# Patient Record
Sex: Female | Born: 1980 | Race: White | Hispanic: Yes | Marital: Married | Smoking: Never smoker
Health system: Southern US, Community
[De-identification: ages and names within clinical notes are randomized; demographics above are authoritative.]

## PROBLEM LIST (undated history)

## (undated) ENCOUNTER — Ambulatory Visit (HOSPITAL_COMMUNITY): Payer: Self-pay

## (undated) ENCOUNTER — Inpatient Hospital Stay (HOSPITAL_COMMUNITY): Payer: Self-pay

## (undated) DIAGNOSIS — Z789 Other specified health status: Secondary | ICD-10-CM

## (undated) HISTORY — PX: NO PAST SURGERIES: SHX2092

---

## 2009-11-15 ENCOUNTER — Ambulatory Visit: Payer: Self-pay | Admitting: Family Medicine

## 2009-11-15 ENCOUNTER — Inpatient Hospital Stay (HOSPITAL_COMMUNITY): Admission: AD | Admit: 2009-11-15 | Discharge: 2009-11-15 | Payer: Self-pay | Admitting: Obstetrics & Gynecology

## 2009-12-27 ENCOUNTER — Inpatient Hospital Stay (HOSPITAL_COMMUNITY)
Admission: AD | Admit: 2009-12-27 | Discharge: 2009-12-29 | Payer: Self-pay | Source: Home / Self Care | Admitting: Obstetrics & Gynecology

## 2009-12-27 ENCOUNTER — Ambulatory Visit: Payer: Self-pay | Admitting: Obstetrics and Gynecology

## 2010-05-09 LAB — CBC
MCH: 22.5 pg — ABNORMAL LOW (ref 26.0–34.0)
MCV: 69.5 fL — ABNORMAL LOW (ref 78.0–100.0)
Platelets: 256 10*3/uL (ref 150–400)
RDW: 17.9 % — ABNORMAL HIGH (ref 11.5–15.5)

## 2010-05-11 LAB — TYPE AND SCREEN
ABO/RH(D): O POS
Antibody Screen: NEGATIVE

## 2010-05-11 LAB — URINALYSIS, ROUTINE W REFLEX MICROSCOPIC
Bilirubin Urine: NEGATIVE
Glucose, UA: NEGATIVE mg/dL
Hgb urine dipstick: NEGATIVE
Protein, ur: NEGATIVE mg/dL
Specific Gravity, Urine: >1.03 — ABNORMAL HIGH (ref 1.005–1.030)
Urobilinogen, UA: 0.2 mg/dL (ref 0.0–1.0)

## 2010-05-11 LAB — URINE MICROSCOPIC-ADD ON

## 2010-05-11 LAB — RAPID URINE DRUG SCREEN, HOSP PERFORMED
Barbiturates: NOT DETECTED
Cocaine: NOT DETECTED
Opiates: NOT DETECTED
Tetrahydrocannabinol: NOT DETECTED

## 2010-05-11 LAB — CBC
Hemoglobin: 10.5 g/dL — ABNORMAL LOW (ref 12.0–15.0)
MCH: 23.8 pg — ABNORMAL LOW (ref 26.0–34.0)
MCHC: 33.1 g/dL (ref 30.0–36.0)
MCV: 71.8 fL — ABNORMAL LOW (ref 78.0–100.0)

## 2010-05-11 LAB — HEPATITIS B SURFACE ANTIGEN: Hepatitis B Surface Ag: NEGATIVE

## 2010-07-18 ENCOUNTER — Emergency Department (HOSPITAL_COMMUNITY)
Admission: EM | Admit: 2010-07-18 | Discharge: 2010-07-19 | Disposition: A | Discharge status: Home / Self Care | Payer: Self-pay | Attending: Emergency Medicine | Admitting: Emergency Medicine

## 2010-07-18 DIAGNOSIS — O036 Delayed or excessive hemorrhage following complete or unspecified spontaneous abortion: Secondary | ICD-10-CM | POA: Insufficient documentation

## 2010-07-18 DIAGNOSIS — N949 Unspecified condition associated with female genital organs and menstrual cycle: Secondary | ICD-10-CM | POA: Insufficient documentation

## 2010-07-18 DIAGNOSIS — R109 Unspecified abdominal pain: Secondary | ICD-10-CM | POA: Insufficient documentation

## 2010-07-18 DIAGNOSIS — R300 Dysuria: Secondary | ICD-10-CM | POA: Insufficient documentation

## 2010-07-18 DIAGNOSIS — O34599 Maternal care for other abnormalities of gravid uterus, unspecified trimester: Secondary | ICD-10-CM | POA: Insufficient documentation

## 2010-07-18 DIAGNOSIS — N83209 Unspecified ovarian cyst, unspecified side: Secondary | ICD-10-CM | POA: Insufficient documentation

## 2010-07-18 LAB — PREGNANCY, URINE: Preg Test, Ur: POSITIVE

## 2010-07-19 ENCOUNTER — Emergency Department (HOSPITAL_COMMUNITY): Payer: Self-pay

## 2010-07-19 LAB — DIFFERENTIAL
Basophils Absolute: 0 10*3/uL (ref 0.0–0.1)
Basophils Relative: 0 % (ref 0–1)
Eosinophils Absolute: 0.2 10*3/uL (ref 0.0–0.7)
Eosinophils Relative: 2 % (ref 0–5)
Lymphs Abs: 2.9 10*3/uL (ref 0.7–4.0)
Neutrophils Relative %: 48 % (ref 43–77)

## 2010-07-19 LAB — CBC
Platelets: 265 10*3/uL (ref 150–400)
RBC: 4.35 MIL/uL (ref 3.87–5.11)
RDW: 14.3 % (ref 11.5–15.5)
WBC: 7 10*3/uL (ref 4.0–10.5)

## 2010-07-19 LAB — WET PREP, GENITAL: Trich, Wet Prep: NONE SEEN

## 2010-07-19 LAB — ABO/RH: ABO/RH(D): O POS

## 2010-07-19 LAB — BASIC METABOLIC PANEL
CO2: 23 mEq/L (ref 19–32)
Calcium: 9.2 mg/dL (ref 8.4–10.5)
Chloride: 104 mEq/L (ref 96–112)
Creatinine, Ser: <0.47 mg/dL (ref 0.4–1.2)
Glucose, Bld: 93 mg/dL (ref 70–99)

## 2010-07-20 LAB — GC/CHLAMYDIA PROBE AMP, GENITAL: Chlamydia, DNA Probe: NEGATIVE

## 2011-02-27 NOTE — L&D Delivery Note (Cosign Needed)
Delivery Note At 9:06 AM a viable female was delivered via Vaginal, Spontaneous Delivery (Presentation: ; Occiput Anterior).  APGAR: 9, 9; weight pending.   Placenta status: normal .  Cord: 3 vessels with the following complications: None.  Anesthesia: None  Episiotomy: None Lacerations: None Est. Blood Loss (mL): 100  Mom to postpartum.  Baby to nursery-stable.  Clancy Gourd 08/08/2011, 9:21 AM

## 2011-07-09 ENCOUNTER — Encounter (HOSPITAL_COMMUNITY): Payer: Self-pay

## 2011-07-09 ENCOUNTER — Inpatient Hospital Stay (HOSPITAL_COMMUNITY): Payer: Self-pay

## 2011-07-09 ENCOUNTER — Inpatient Hospital Stay (HOSPITAL_COMMUNITY)
Admission: AD | Admit: 2011-07-09 | Discharge: 2011-07-09 | Disposition: A | Discharge status: Home / Self Care | Payer: Self-pay | Source: Ambulatory Visit | Attending: Obstetrics & Gynecology | Admitting: Obstetrics & Gynecology

## 2011-07-09 DIAGNOSIS — O0933 Supervision of pregnancy with insufficient antenatal care, third trimester: Secondary | ICD-10-CM

## 2011-07-09 DIAGNOSIS — O26893 Other specified pregnancy related conditions, third trimester: Secondary | ICD-10-CM

## 2011-07-09 DIAGNOSIS — R51 Headache: Secondary | ICD-10-CM | POA: Insufficient documentation

## 2011-07-09 DIAGNOSIS — O288 Other abnormal findings on antenatal screening of mother: Secondary | ICD-10-CM

## 2011-07-09 DIAGNOSIS — O99891 Other specified diseases and conditions complicating pregnancy: Secondary | ICD-10-CM | POA: Insufficient documentation

## 2011-07-09 DIAGNOSIS — O0930 Supervision of pregnancy with insufficient antenatal care, unspecified trimester: Secondary | ICD-10-CM | POA: Insufficient documentation

## 2011-07-09 DIAGNOSIS — O4190X0 Disorder of amniotic fluid and membranes, unspecified, unspecified trimester, not applicable or unspecified: Secondary | ICD-10-CM

## 2011-07-09 HISTORY — DX: Other specified health status: Z78.9

## 2011-07-09 LAB — COMPREHENSIVE METABOLIC PANEL
AST: 11 U/L (ref 0–37)
Albumin: 2.3 g/dL — ABNORMAL LOW (ref 3.5–5.2)
BUN: 7 mg/dL (ref 6–23)
Calcium: 8.7 mg/dL (ref 8.4–10.5)
Creatinine, Ser: 0.37 mg/dL — ABNORMAL LOW (ref 0.50–1.10)
GFR calc non Af Amer: >90 mL/min (ref >90)

## 2011-07-09 LAB — CBC
Hemoglobin: 11 g/dL — ABNORMAL LOW (ref 12.0–15.0)
RBC: 4.8 MIL/uL (ref 3.87–5.11)
WBC: 9.5 10*3/uL (ref 4.0–10.5)

## 2011-07-09 LAB — RPR: RPR Ser Ql: NONREACTIVE

## 2011-07-09 LAB — URINALYSIS, ROUTINE W REFLEX MICROSCOPIC
Bilirubin Urine: NEGATIVE
Glucose, UA: NEGATIVE mg/dL
Ketones, ur: NEGATIVE mg/dL
Nitrite: NEGATIVE
Specific Gravity, Urine: 1.015 (ref 1.005–1.030)
pH: 6 (ref 5.0–8.0)

## 2011-07-09 LAB — DIFFERENTIAL
Lymphocytes Relative: 20 % (ref 12–46)
Lymphs Abs: 1.9 10*3/uL (ref 0.7–4.0)
Monocytes Relative: 9 % (ref 3–12)
Neutro Abs: 6.7 10*3/uL (ref 1.7–7.7)
Neutrophils Relative %: 71 % (ref 43–77)

## 2011-07-09 LAB — TYPE AND SCREEN: ABO/RH(D): O POS

## 2011-07-09 LAB — RUBELLA SCREEN: Rubella: 199.3 IU/mL — ABNORMAL HIGH

## 2011-07-09 LAB — URINE MICROSCOPIC-ADD ON

## 2011-07-09 LAB — WET PREP, GENITAL
Clue Cells Wet Prep HPF POC: NONE SEEN
Yeast Wet Prep HPF POC: NONE SEEN

## 2011-07-09 LAB — PROTEIN / CREATININE RATIO, URINE
Creatinine, Urine: 44.72 mg/dL
Protein Creatinine Ratio: 0.13 (ref 0.00–0.15)
Total Protein, Urine: 6 mg/dL

## 2011-07-09 MED ORDER — ACETAMINOPHEN 325 MG PO TABS
650.0000 mg | ORAL_TABLET | Freq: Once | ORAL | Status: AC
  Administered 2011-07-09: 650 mg via ORAL
  Filled 2011-07-09: qty 2

## 2011-07-09 NOTE — MAU Note (Signed)
Headache, face hurts.  Hands are swelling.  Pressure in bones. "cold"  5a.m. Boy was numb.  Due ? In August. No prenatal care.

## 2011-07-09 NOTE — MAU Note (Signed)
No adverse effect from tylenol, rates pain same-9/10.

## 2011-07-09 NOTE — Progress Notes (Signed)
[facility] Department of Clinical Social Work Documentation of Interpretation   I assisted ___Jolynn RN________________ with interpretation of __questions____________________ for this patient. 

## 2011-07-09 NOTE — MAU Provider Note (Signed)
History     CSN: [id number]  Arrival date and time: 07/09/11 1147   First Provider Initiated Contact with Patient 07/09/11 1314      Chief Complaint  Patient presents with  . Abdominal Pain  . Generalized Body Aches  . 7 Months no PNC    HPI [name] is 31 y.o. [id number] LMP 12/21/10.  28 weeks presenting with headache rating it 9/10 that is radiating down the right side of her face, bones hurting, hands are swollen, dysuria, and pressure from the baby.  No prenatal care states she has tried but doesn't qualify and doesn't have money.  Denies changes in speech, balance, or weakness.  Denies vaginal bleeding or discharge.  Eda,translator reports she has not eaten in 18 hours because she wasn't hungry and she didn't feel well.    Past Medical History  Diagnosis Date  . No pertinent past medical history     Past Surgical History  Procedure Date  . No past surgeries     Family History  Problem Relation Age of Onset  . Anesthesia problems Neg Hx     History  Substance Use Topics  . Smoking status: Never Smoker   . Smokeless tobacco: Never Used  . Alcohol Use: No    Allergies: No Known Allergies  No prescriptions prior to admission    Review of Systems  Gastrointestinal: Positive for melena ( pelvic pressure.  Negative for vaginal bleeding or discharge). Negative for nausea, vomiting, abdominal pain, diarrhea and constipation.  Genitourinary: Positive for dysuria.  Musculoskeletal:       +bone pain  Neurological: Positive for headaches.   Physical Exam   Blood pressure 136/85, pulse 94, temperature 97.5 F (36.4 C), temperature source Oral, resp. rate 20, height 5\' 1"  (1.549 m), weight 89.812 kg (198 lb), SpO2 100.00%.  Physical Exam  Constitutional: She is oriented to person, place, and time. She appears well-developed and well-nourished. She appears ill.  HENT:  Head: Normocephalic.  Eyes: Pupils are equal, round, and reactive to light.    Cardiovascular: Normal rate.   Respiratory: Effort normal.  GI: Soft. There is no tenderness. There is no rebound and no guarding.       Fundal height palpated at greater than LMP would have her.  Genitourinary: Uterus is enlarged (gravid). No tenderness or bleeding around the vagina. No vaginal discharge found.       Cervix is fingertip and 50% effaced.   Neurological: She is alert and oriented to person, place, and time. Coordination normal.  Skin: Skin is warm and dry.   Results for orders placed during the hospital encounter of 07/09/11 (from the past 24 hour(s))  URINALYSIS, ROUTINE W REFLEX MICROSCOPIC     Status: Abnormal   Collection Time   07/09/11 12:26 PM      Component Value Range   Color, Urine YELLOW  YELLOW    APPearance CLEAR  CLEAR    Specific Gravity, Urine 1.015  1.005 - 1.030    pH 6.0  5.0 - 8.0    Glucose, UA NEGATIVE  NEGATIVE (mg/dL)   Hgb urine dipstick NEGATIVE  NEGATIVE    Bilirubin Urine NEGATIVE  NEGATIVE    Ketones, ur NEGATIVE  NEGATIVE (mg/dL)   Protein, ur NEGATIVE  NEGATIVE (mg/dL)   Urobilinogen, UA 0.2  0.0 - 1.0 (mg/dL)   Nitrite NEGATIVE  NEGATIVE    Leukocytes, UA SMALL (*) NEGATIVE   URINE MICROSCOPIC-ADD ON     Status: Abnormal  Collection Time   07/09/11 12:26 PM      Component Value Range   Squamous Epithelial / LPF FEW (*) RARE    WBC, UA 7-10  <3 (WBC/hpf)  WET PREP, GENITAL     Status: Abnormal   Collection Time   07/09/11  1:44 PM      Component Value Range   Yeast Wet Prep HPF POC NONE SEEN  NONE SEEN    Trich, Wet Prep NONE SEEN  NONE SEEN    Clue Cells Wet Prep HPF POC NONE SEEN  NONE SEEN    WBC, Wet Prep HPF POC TOO NUMEROUS TO COUNT (*) NONE SEEN   CBC     Status: Abnormal   Collection Time   07/09/11  1:59 PM      Component Value Range   WBC 9.5  4.0 - 10.5 (K/uL)   RBC 4.80  3.87 - 5.11 (MIL/uL)   Hemoglobin 11.0 (*) 12.0 - 15.0 (g/dL)   HCT 78.4 (*) 69.6 - 46.0 (%)   MCV 71.9 (*) 78.0 - 100.0 (fL)   MCH 22.9 (*)  26.0 - 34.0 (pg)   MCHC 31.9  30.0 - 36.0 (g/dL)   RDW 29.5 (*) 28.4 - 15.5 (%)   Platelets 214  150 - 400 (K/uL)  DIFFERENTIAL     Status: Normal   Collection Time   07/09/11  1:59 PM      Component Value Range   Neutrophils Relative 71  43 - 77 (%)   Neutro Abs 6.7  1.7 - 7.7 (K/uL)   Lymphocytes Relative 20  12 - 46 (%)   Lymphs Abs 1.9  0.7 - 4.0 (K/uL)   Monocytes Relative 9  3 - 12 (%)   Monocytes Absolute 0.8  0.1 - 1.0 (K/uL)   Eosinophils Relative 1  0 - 5 (%)   Eosinophils Absolute 0.1  0.0 - 0.7 (K/uL)   Basophils Relative 0  0 - 1 (%)   Basophils Absolute 0.0  0.0 - 0.1 (K/uL)  COMPREHENSIVE METABOLIC PANEL     Status: Abnormal   Collection Time   07/09/11  1:59 PM      Component Value Range   Sodium 136  135 - 145 (mEq/L)   Potassium 3.4 (*) 3.5 - 5.1 (mEq/L)   Chloride 104  96 - 112 (mEq/L)   CO2 21  19 - 32 (mEq/L)   Glucose, Bld 79  70 - 99 (mg/dL)   BUN 7  6 - 23 (mg/dL)   Creatinine, Ser 1.32 (*) 0.50 - 1.10 (mg/dL)   Calcium 8.7  8.4 - 44.0 (mg/dL)   Total Protein 6.1  6.0 - 8.3 (g/dL)   Albumin 2.3 (*) 3.5 - 5.2 (g/dL)   AST 11  0 - 37 (U/L)   ALT 6  0 - 35 (U/L)   Alkaline Phosphatase 195 (*) 39 - 117 (U/L)   Total Bilirubin 0.3  0.3 - 1.2 (mg/dL)   GFR calc non Af Amer >90  >90 (mL/min)   GFR calc Af Amer >90  >90 (mL/min)  TYPE AND SCREEN     Status: Normal   Collection Time   07/09/11  2:00 PM      Component Value Range   ABO/RH(D) O POS     Antibody Screen NEG     Sample Expiration 07/12/2011      Filed Vitals:   07/09/11 1409 07/09/11 1414 07/09/11 1417 07/09/11 1419  BP:   122/75   Pulse:  90 95 86 92  Temp:      TempSrc:      Resp:      Height:      Weight:      SpO2: 99% 98%  99%  ULTRASOUND:  [redacted]w[redacted]d with FHR 156.  EDD 08/17/11.  Cephalic, anterior placenta.  AFI 10.21, 21% with largest pocket 3.43cm.  Suboptimal visualiation of anatomy due to advanced GA, however no fetal anomalies seen.  4cm left adnexal cyst with benign features.     MAU Course  Procedures  Protein/creatine ratio pending.  MDM 16:14  Patient states that her headache is much better says only a  Little pain now.  Blood pressure is lower since admission.   16:50  With interpreter, I reported her lab and ultrasound results.  Also that the clinic will call her in a few days to set up appointment and repeat AFI.   Assessment and Plan  A:  Advanced pregnancy at [redacted]w[redacted]d without prenatal care      Headache much improved with tylenol     Subjectively low AFI      P:  Appointment requested from Clinic       Will need repeat AFI in 1 week Labor precautions given including decreased fetal movement.     Kick counts given.  May take tylenol for headache, discomfort  Reeve Mallo,EVE M 07/09/2011, 1:28 PM

## 2011-07-09 NOTE — Discharge Instructions (Signed)
Prevencin de Sport and exercise psychologist (Preventing Preterm Labor) Un parto prematuro ocurre cuando la mujer embarazada tiene contracciones uterinas que causan la apertura, el acortamiento y el afinamiento del cuello del tero, antes de las 37 semanas de [place]. Tendr contracciones regulares cada 2 a 3 minutos. Esto generalmente causa molestias o dolor. CUIDADOS EN EL HOGAR  Consuma una dieta saludable.   [name] las vitaminas segn le haya indicado el mdico.   Beba una cantidad de lquido suficiente como para [name] la orina de tono claro o color amarillo plido todos [name].   Descanse y duerma.   No tenga relaciones sexuales si tiene un parto prematuro o alto riesgo de tenerlo.   Siga las instrucciones del mdico acerca de su [name], los medicamentos y los exmenes.   Evite el estrs.   Evite los esfuerzos extenuantes o la actividad fsica extensa.   No fume.  SOLICITE AYUDA DE INMEDIATO SI:   Tiene contracciones.   Siente dolor abdominal.   Tiene sangrado que proviene de la vagina.   Siente dolor al [name].   Observa una secrecin anormal que proviene de la vagina.   Tiene una temperatura oral de ms de 102 F (38.9 C).  ASEGRESE DE QUE:  Comprende estas instrucciones.   Controlar su enfermedad.   Solicitar ayuda si no mejora o si empeora.  Document Released: 03/17/2010 Document Revised: 02/01/2011 [name] Patient Information 2012 [name], [name].Mtodo para contar los movimientos fetales (Fetal Movement Counts) Nombre de la paciente: __________________________________________________ [name] probable de parto:____________________ En los embarazos de alto riesgo se recomienda contar las pataditas, pero tambin es una buena idea que lo hagan todas las [name]. Comience a contarlas a las 28 semanas de embarazo. Los movimientos fetales aumentan luego de una comida Immunologist o de comer o beber algo dulce (el nivel de azcar en la sangre est ms alto). Tambin es  importante beber gran cantidad de lquidos (hidratarse bien) antes de contar. Si se recuesta sobre el lado izquierdo mejorar la Designer, industrial/product, o puede sentarse en una silla cmoda con los brazos sobre el abdomen y sin distracciones que la rodeen. CONTANDO  Trate de contar a la [name] lo haga.   Marque el da y la hora y vea cunto le lleva sentir 10 movimientos (patadas, agitaciones, sacudones, vueltas). Debe sentir al menos 10 movimientos en 2 horas. Probablemente sienta los 10 movimientos en menos de dos horas. Si no los siente, espere una hora y cuente nuevamente. Luego de [name] tendr un patrn.   Debemos observar si hay cambios en el patrn o no hay suficientes pataditas en 2 horas. Le lleva ms tiempo contar los 10 movimientos?  SOLICITE ATENCIN MDICA SI:  Siente menos de 10 pataditas en 2 horas. Intntelo dos veces.   No siente movimientos durante 1 hora.   El patrn se modifica o le lleva ms tiempo Art gallery manager las 10 pataditas.   Siente que el beb no se mueve como lo hace habitualmente.  Fecha: ____________ Movimientos: ____________ Comienzo hora: ____________ [name]: ____________ [name]: ____________ Movimientos: ____________ Comienzo hora: ____________ [name]: ____________ [name]: ____________ Movimientos: ____________ Comienzo hora: ____________ [name]: ____________ [name]: ____________ Movimientos: ____________ Comienzo hora: ____________ [name]: ____________ [name]: ____________ Movimientos: ____________ Comienzo hora: ____________ [name]: ____________ [name]: ____________ Movimientos: ____________ Comienzo hora: ____________ [name]: ____________ [name]: ____________ Movimientos: ____________ Comienzo hora: ____________ [name]: ____________  [name]: ____________ Movimientos: ____________ Comienzo hora: ____________ [name]: ____________ [name]: ____________ Movimientos: ____________ Comienzo hora:  ____________ [name]:  ____________ [name]: ____________ Movimientos: ____________ Comienzo hora: ____________ [name]: ____________ [name]: ____________ Movimientos: ____________ Comienzo hora: ____________ [name]: ____________ [name]: ____________ Movimientos: ____________ Comienzo hora: ____________ [name]: ____________ [name]: ____________ Movimientos: ____________ Comienzo hora: ____________ [name]: ____________ [name]: ____________ Movimientos: ____________ Comienzo hora: ____________ [name]: ____________  [name]: ____________ Movimientos: ____________ Comienzo hora: ____________ [name]: ____________ [name]: ____________ Movimientos: ____________ Comienzo hora: ____________ [name]: ____________ [name]: ____________ Movimientos: ____________ Comienzo hora: ____________ [name]: ____________ [name]: ____________ Movimientos: ____________ Comienzo hora: ____________ [name]: ____________ [name]: ____________ Movimientos: ____________ Comienzo hora: ____________ [name]: ____________ [name]: ____________ Movimientos: ____________ Comienzo hora: ____________ [name]: ____________ [name]: ____________ Movimientos: ____________ Comienzo hora: ____________ [name]: ____________  [name]: ____________ Movimientos: ____________ Comienzo hora: ____________ [name]: ____________ [name]: ____________ Movimientos: ____________ Comienzo hora: ____________ [name]: ____________ [name]: ____________ Movimientos: ____________ Comienzo hora: ____________ [name]: ____________ [name]: ____________ Movimientos: ____________ Comienzo hora: ____________ [name]: ____________ [name]: ____________ Movimientos: ____________ Comienzo hora: ____________ [name]: ____________ [name]: ____________ Movimientos: ____________ Comienzo hora: ____________ [name]: ____________ [name]: ____________ Movimientos: ____________ Comienzo hora: ____________ [name]: ____________  [name]: ____________ Movimientos: ____________ Comienzo hora:  ____________ [name]: ____________ [name]: ____________ Movimientos: ____________ Comienzo hora: ____________ [name]: ____________ [name]: ____________ Movimientos: ____________ Comienzo hora: ____________ [name]: ____________ [name]: ____________ Movimientos: ____________ Comienzo hora: ____________ [name]: ____________ [name]: ____________ Movimientos: ____________ Comienzo hora: ____________ [name]: ____________ [name]: ____________ Movimientos: ____________ Comienzo hora: ____________ [name]: ____________ [name]: ____________ Movimientos: ____________ Comienzo hora: ____________ [name]: ____________  [name]: ____________ Movimientos: ____________ Comienzo hora: ____________ [name]: ____________ [name]: ____________ Movimientos: ____________ Comienzo hora: ____________ [name]: ____________ [name]: ____________ Movimientos: ____________ Comienzo hora: ____________ [name]: ____________ [name]: ____________ Movimientos: ____________ Comienzo hora: ____________ [name]: ____________ [name]: ____________ Movimientos: ____________ Comienzo hora: ____________ [name]: ____________ [name]: ____________ Movimientos: ____________ Comienzo hora: ____________ [name]: ____________ [name]: ____________ Movimientos: ____________ Comienzo hora: ____________ [name]: ____________  [name]: ____________ Movimientos: ____________ Comienzo hora: ____________ [name]: ____________ [name]: ____________ Movimientos: ____________ Comienzo hora: ____________ [name]: ____________ [name]: ____________ Movimientos: ____________ Comienzo hora: ____________ [name]: ____________ [name]: ____________ Movimientos: ____________ Comienzo hora: ____________ [name]: ____________ [name]: ____________ Movimientos: ____________ Comienzo hora: ____________ [name]: ____________ [name]: ____________ Movimientos: ____________ Comienzo hora: ____________ [name]: ____________ [name]: ____________ Movimientos: ____________  Comienzo hora: ____________ [name]: ____________  [name]: ____________ Movimientos: ____________ Comienzo hora: ____________ [name]: ____________ [name]: ____________ Movimientos: ____________ Comienzo hora: ____________ [name]: ____________ [name]: ____________ Movimientos: ____________ Comienzo hora: ____________ [name]: ____________ [name]: ____________ Movimientos: ____________ Comienzo hora: ____________ [name]: ____________ [name]: ____________ Movimientos: ____________ Comienzo hora: ____________ [name]: ____________ [name]: ____________ Movimientos: ____________ Comienzo hora: ____________ [name]: ____________ [name]: ____________ Movimientos: ____________ Comienzo hora: ____________ Fin hora: ____________  Document Released: 05/22/2007 Document Revised: 02/01/2011 [employer] Patient Information 2012 [place] [place].

## 2011-07-10 LAB — GC/CHLAMYDIA PROBE AMP, GENITAL
Chlamydia, DNA Probe: NEGATIVE
GC Probe Amp, Genital: NEGATIVE

## 2011-07-10 NOTE — MAU Provider Note (Signed)
Attestation of Attending Supervision of Advanced Practitioner: Evaluation and management procedures were performed by the [facility] Fellow/PA/CNM/NP under my supervision and collaboration. Chart reviewed, and agree with management and plan.  Jaynie Collins, M.D. 07/10/2011 9:34 AM

## 2011-08-03 ENCOUNTER — Inpatient Hospital Stay (HOSPITAL_COMMUNITY)
Admission: AD | Admit: 2011-08-03 | Discharge: 2011-08-03 | Disposition: A | Discharge status: Home / Self Care | Payer: Self-pay | Source: Ambulatory Visit | Attending: Obstetrics & Gynecology | Admitting: Obstetrics & Gynecology

## 2011-08-03 ENCOUNTER — Encounter (HOSPITAL_COMMUNITY): Payer: Self-pay | Admitting: *Deleted

## 2011-08-03 DIAGNOSIS — O0930 Supervision of pregnancy with insufficient antenatal care, unspecified trimester: Secondary | ICD-10-CM | POA: Insufficient documentation

## 2011-08-03 DIAGNOSIS — O368190 Decreased fetal movements, unspecified trimester, not applicable or unspecified: Secondary | ICD-10-CM | POA: Insufficient documentation

## 2011-08-03 DIAGNOSIS — O288 Other abnormal findings on antenatal screening of mother: Secondary | ICD-10-CM

## 2011-08-03 LAB — COMPREHENSIVE METABOLIC PANEL
AST: 10 U/L (ref 0–37)
Albumin: 2.1 g/dL — ABNORMAL LOW (ref 3.5–5.2)
BUN: 6 mg/dL (ref 6–23)
Chloride: 104 mEq/L (ref 96–112)
Creatinine, Ser: 0.54 mg/dL (ref 0.50–1.10)
Potassium: 3.6 mEq/L (ref 3.5–5.1)
Total Bilirubin: 0.2 mg/dL — ABNORMAL LOW (ref 0.3–1.2)
Total Protein: 5.8 g/dL — ABNORMAL LOW (ref 6.0–8.3)

## 2011-08-03 MED ORDER — HYDROXYZINE HCL 25 MG PO TABS: 25.0000 mg | ORAL_TABLET | Freq: Three times a day (TID) | ORAL | Status: DC | PRN

## 2011-08-03 MED ORDER — HYDROXYZINE HCL 25 MG PO TABS
25.0000 mg | ORAL_TABLET | Freq: Three times a day (TID) | ORAL | Status: DC | PRN
  Administered 2011-08-03: 25 mg via ORAL
  Filled 2011-08-03: qty 1

## 2011-08-03 NOTE — MAU Note (Signed)
Patient states she has had no prenatal care. States the baby has not been moving well. Denies any pain at this time, has some 2 days ago. No bleeding or leaking fluid.

## 2011-08-03 NOTE — MAU Provider Note (Signed)
  History     CSN: [id number]  Arrival date and time: 08/03/11 1336   First Provider Initiated Contact with Patient 08/03/11 1501      Chief Complaint  Patient presents with  . Decreased Fetal Movement   HPI This is a 31 year old [id number] at [redacted] weeks EGA with no prenatal care.  Presents with decreased fetal activity - no fetal movement since Monday.  No provoking or palliating factors.  No vaginal discharge, leaking fluid, or bleeding.  OB History    Grav Para Term Preterm Abortions TAB SAB Ect Mult Living   7 5 5  1  1   5       Past Medical History  Diagnosis Date  . No pertinent past medical history     Past Surgical History  Procedure Date  . No past surgeries     Family History  Problem Relation Age of Onset  . Anesthesia problems Neg Hx     History  Substance Use Topics  . Smoking status: Never Smoker   . Smokeless tobacco: Never Used  . Alcohol Use: No    Allergies: No Known Allergies  Prescriptions prior to admission  Medication Sig Dispense Refill  . Prenatal Vit-Fe Fumarate-FA (PRENATAL MULTIVITAMIN) TABS Take 1 tablet by mouth daily.        ROS Physical Exam   Blood pressure 121/77, pulse 91, temperature 98.7 F (37.1 C), resp. rate 20, height 5\' 1"  (1.549 m), weight 92.08 kg (203 lb), SpO2 99.00%.  Physical Exam  Constitutional: She is oriented to person, place, and time. She appears well-developed and well-nourished.  Neurological: She is alert and oriented to person, place, and time.  Skin: Skin is warm and dry.  Psychiatric: She has a normal mood and affect. Her behavior is normal. Judgment and thought content normal.    MAU Course  Procedures NST - category 1 tracing with multiple accelerations seen.  One isolated late decel seen, but no further episodes after 1 hour of monitoring.  MDM   Assessment and Plan  1.  Decreased fetal activity.  2.  Low normal AFI.    Patient given juice and observed - had multiple movements.  Scheduled  outpatient [place] for AFI.  Ambika Zettlemoyer JEHIEL 08/03/2011, 4:27 PM

## 2011-08-03 NOTE — MAU Note (Signed)
States clinic never called her with an appointment. States she called the clinic and they said they would call her back, but never did. States she was told she had low fluid. States she passed some foul smelling yellowish fluid about 3 days ago.

## 2011-08-03 NOTE — Progress Notes (Signed)
[facility] Department of Clinical Social Work Documentation of Interpretation   I assisted _Susan RN__________________ with interpretation of ___questions___________________ for this patient. 

## 2011-08-03 NOTE — Discharge Instructions (Signed)
Mtodo para contar los movimientos fetales (Fetal Movement Counts) Nombre de la paciente: __________________________________________________ Fecha probable de parto:____________________ En los embarazos de alto riesgo se recomienda contar las pataditas, pero tambin es una buena idea que lo hagan todas las embarazadas. Comience a contarlas a las 28 semanas de embarazo. Los movimientos fetales aumentan luego de una comida completa o de comer o beber algo dulce (el nivel de azcar en la sangre est ms alto). Tambin es importante beber gran cantidad de lquidos (hidratarse bien) antes de contar. Si se recuesta sobre el lado izquierdo mejorar la circulacin, o puede sentarse en una silla cmoda con los brazos sobre el abdomen y sin distracciones que la rodeen. CONTANDO  Trate de contar a la misma hora todos los das que lo haga.   Marque el da y la hora y vea cunto le lleva sentir 10 movimientos (patadas, agitaciones, sacudones, vueltas). Debe sentir al menos 10 movimientos en 2 horas. Probablemente sienta los 10 movimientos en menos de dos horas. Si no los siente, espere una hora y cuente nuevamente. Luego de algunos das tendr un patrn.   Debemos observar si hay cambios en el patrn o no hay suficientes pataditas en 2 horas. Le lleva ms tiempo contar los 10 movimientos?  SOLICITE ATENCIN MDICA SI:  Siente menos de 10 pataditas en 2 horas. Intntelo dos veces.   No siente movimientos durante 1 hora.   El patrn se modifica o le lleva ms tiempo cada da contar las 10 pataditas.   Siente que el beb no se mueve como lo hace habitualmente.  Fecha: ____________ Movimientos: ____________ Comienzo hora: ____________ Fin hora: ____________ Fecha: ____________ Movimientos: ____________ Comienzo hora: ____________ Fin hora: ____________ Fecha: ____________ Movimientos: ____________ Comienzo hora: ____________ Fin hora: ____________ Fecha: ____________ Movimientos: ____________ Comienzo hora:  ____________ Fin hora: ____________ Fecha: ____________ Movimientos: ____________ Comienzo hora: ____________ Fin hora: ____________ Fecha: ____________ Movimientos: ____________ Comienzo hora: ____________ Fin hora: ____________ Fecha: ____________ Movimientos: ____________ Comienzo hora: ____________ Fin hora: ____________  Fecha: ____________ Movimientos: ____________ Comienzo hora: ____________ Fin hora: ____________ Fecha: ____________ Movimientos: ____________ Comienzo hora: ____________ Fin hora: ____________ Fecha: ____________ Movimientos: ____________ Comienzo hora: ____________ Fin hora: ____________ Fecha: ____________ Movimientos: ____________ Comienzo hora: ____________ Fin hora: ____________ Fecha: ____________ Movimientos: ____________ Comienzo hora: ____________ Fin hora: ____________ Fecha: ____________ Movimientos: ____________ Comienzo hora: ____________ Fin hora: ____________ Fecha: ____________ Movimientos: ____________ Comienzo hora: ____________ Fin hora: ____________  Fecha: ____________ Movimientos: ____________ Comienzo hora: ____________ Fin hora: ____________ Fecha: ____________ Movimientos: ____________ Comienzo hora: ____________ Fin hora: ____________ Fecha: ____________ Movimientos: ____________ Comienzo hora: ____________ Fin hora: ____________ Fecha: ____________ Movimientos: ____________ Comienzo hora: ____________ Fin hora: ____________ Fecha: ____________ Movimientos: ____________ Comienzo hora: ____________ Fin hora: ____________ Fecha: ____________ Movimientos: ____________ Comienzo hora: ____________ Fin hora: ____________ Fecha: ____________ Movimientos: ____________ Comienzo hora: ____________ Fin hora: ____________  Fecha: ____________ Movimientos: ____________ Comienzo hora: ____________ Fin hora: ____________ Fecha: ____________ Movimientos: ____________ Comienzo hora: ____________ Fin hora: ____________ Fecha: ____________ Movimientos: ____________  Comienzo hora: ____________ Fin hora: ____________ Fecha: ____________ Movimientos: ____________ Comienzo hora: ____________ Fin hora: ____________ Fecha: ____________ Movimientos: ____________ Comienzo hora: ____________ Fin hora: ____________ Fecha: ____________ Movimientos: ____________ Comienzo hora: ____________ Fin hora: ____________ Fecha: ____________ Movimientos: ____________ Comienzo hora: ____________ Fin hora: ____________  Fecha: ____________ Movimientos: ____________ Comienzo hora: ____________ Fin hora: ____________ Fecha: ____________ Movimientos: ____________ Comienzo hora: ____________ Fin hora: ____________ Fecha: ____________ Movimientos: ____________ Comienzo hora: ____________ Fin hora: ____________ Fecha: ____________ Movimientos: ____________ Comienzo hora: ____________ Fin hora: ____________ Fecha: ____________ Movimientos:   ____________ Comienzo hora: ____________ Fin hora: ____________ Fecha: ____________ Movimientos: ____________ Comienzo hora: ____________ Fin hora: ____________ Fecha: ____________ Movimientos: ____________ Comienzo hora: ____________ Fin hora: ____________  Fecha: ____________ Movimientos: ____________ Comienzo hora: ____________ Fin hora: ____________ Fecha: ____________ Movimientos: ____________ Comienzo hora: ____________ Fin hora: ____________ Fecha: ____________ Movimientos: ____________ Comienzo hora: ____________ Fin hora: ____________ Fecha: ____________ Movimientos: ____________ Comienzo hora: ____________ Fin hora: ____________ Fecha: ____________ Movimientos: ____________ Comienzo hora: ____________ Fin hora: ____________ Fecha: ____________ Movimientos: ____________ Comienzo hora: ____________ Fin hora: ____________ Fecha: ____________ Movimientos: ____________ Comienzo hora: ____________ Fin hora: ____________  Fecha: ____________ Movimientos: ____________ Comienzo hora: ____________ Fin hora: ____________ Fecha: ____________ Movimientos:  ____________ Comienzo hora: ____________ Fin hora: ____________ Fecha: ____________ Movimientos: ____________ Comienzo hora: ____________ Fin hora: ____________ Fecha: ____________ Movimientos: ____________ Comienzo hora: ____________ Fin hora: ____________ Fecha: ____________ Movimientos: ____________ Comienzo hora: ____________ Fin hora: ____________ Fecha: ____________ Movimientos: ____________ Comienzo hora: ____________ Fin hora: ____________ Fecha: ____________ Movimientos: ____________ Comienzo hora: ____________ Fin hora: ____________  Fecha: ____________ Movimientos: ____________ Comienzo hora: ____________ Fin hora: ____________ Fecha: ____________ Movimientos: ____________ Comienzo hora: ____________ Fin hora: ____________ Fecha: ____________ Movimientos: ____________ Comienzo hora: ____________ Fin hora: ____________ Fecha: ____________ Movimientos: ____________ Comienzo hora: ____________ Fin hora: ____________ Fecha: ____________ Movimientos: ____________ Comienzo hora: ____________ Fin hora: ____________ Fecha: ____________ Movimientos: ____________ Comienzo hora: ____________ Fin hora: ____________ Fecha: ____________ Movimientos: ____________ Comienzo hora: ____________ Fin hora: ____________  Document Released: 05/22/2007 Document Revised: 02/01/2011 ExitCare Patient Information 2012 [employer]. 

## 2011-08-06 LAB — CULTURE, BETA STREP (GROUP B ONLY)

## 2011-08-07 ENCOUNTER — Encounter: Payer: Self-pay | Admitting: Advanced Practice Midwife

## 2011-08-07 ENCOUNTER — Telehealth: Payer: Self-pay | Admitting: Advanced Practice Midwife

## 2011-08-07 NOTE — Telephone Encounter (Signed)
Called patient about appointment. No answer. Will be sending a letter about this appointment.

## 2011-08-08 ENCOUNTER — Encounter (HOSPITAL_COMMUNITY): Payer: Self-pay | Admitting: *Deleted

## 2011-08-08 ENCOUNTER — Inpatient Hospital Stay (HOSPITAL_COMMUNITY)
Admission: AD | Admit: 2011-08-08 | Discharge: 2011-08-09 | DRG: 775 | Disposition: A | Discharge status: Home / Self Care | Payer: Medicaid Other | Source: Ambulatory Visit | Attending: Obstetrics & Gynecology | Admitting: Obstetrics & Gynecology

## 2011-08-08 LAB — CBC
Hemoglobin: 11.8 g/dL — ABNORMAL LOW (ref 12.0–15.0)
MCH: 23 pg — ABNORMAL LOW (ref 26.0–34.0)
MCV: 72.5 fL — ABNORMAL LOW (ref 78.0–100.0)
RBC: 5.12 MIL/uL — ABNORMAL HIGH (ref 3.87–5.11)

## 2011-08-08 LAB — OB RESULTS CONSOLE HIV ANTIBODY (ROUTINE TESTING): HIV: NONREACTIVE

## 2011-08-08 MED ORDER — OXYTOCIN BOLUS FROM INFUSION
500.0000 mL | Freq: Once | INTRAVENOUS | Status: DC
  Filled 2011-08-08: qty 500

## 2011-08-08 MED ORDER — IBUPROFEN 600 MG PO TABS: 600.0000 mg | ORAL_TABLET | Freq: Four times a day (QID) | ORAL | Status: DC | PRN

## 2011-08-08 MED ORDER — BENZOCAINE-MENTHOL 20-0.5 % EX AERO
1.0000 "application " | INHALATION_SPRAY | CUTANEOUS | Status: DC | PRN
  Administered 2011-08-08: 1 via TOPICAL
  Filled 2011-08-08 (×2): qty 56

## 2011-08-08 MED ORDER — CITRIC ACID-SODIUM CITRATE 334-500 MG/5ML PO SOLN: 30.0000 mL | ORAL | Status: DC | PRN

## 2011-08-08 MED ORDER — DIPHENHYDRAMINE HCL 25 MG PO CAPS: 25.0000 mg | ORAL_CAPSULE | Freq: Four times a day (QID) | ORAL | Status: DC | PRN

## 2011-08-08 MED ORDER — LIDOCAINE HCL (PF) 1 % IJ SOLN
30.0000 mL | INTRAMUSCULAR | Status: DC | PRN
  Filled 2011-08-08: qty 30

## 2011-08-08 MED ORDER — ONDANSETRON HCL 4 MG/2ML IJ SOLN: 4.0000 mg | INTRAMUSCULAR | Status: DC | PRN

## 2011-08-08 MED ORDER — LACTATED RINGERS IV SOLN: 500.0000 mL | INTRAVENOUS | Status: DC | PRN

## 2011-08-08 MED ORDER — OXYTOCIN 20 UNITS IN LACTATED RINGERS INFUSION - SIMPLE
125.0000 mL/h | Freq: Once | INTRAVENOUS | Status: AC
  Administered 2011-08-08: 999 mL/h via INTRAVENOUS
  Filled 2011-08-08: qty 1000

## 2011-08-08 MED ORDER — SENNOSIDES-DOCUSATE SODIUM 8.6-50 MG PO TABS
2.0000 | ORAL_TABLET | Freq: Every day | ORAL | Status: DC
  Administered 2011-08-08: 2 via ORAL

## 2011-08-08 MED ORDER — WITCH HAZEL-GLYCERIN EX PADS: 1.0000 "application " | MEDICATED_PAD | CUTANEOUS | Status: DC | PRN

## 2011-08-08 MED ORDER — ZOLPIDEM TARTRATE 5 MG PO TABS: 5.0000 mg | ORAL_TABLET | Freq: Every evening | ORAL | Status: DC | PRN

## 2011-08-08 MED ORDER — PRENATAL MULTIVITAMIN CH
1.0000 | ORAL_TABLET | Freq: Every day | ORAL | Status: DC
  Administered 2011-08-08 – 2011-08-09 (×2): 1 via ORAL
  Filled 2011-08-08 (×2): qty 1

## 2011-08-08 MED ORDER — ONDANSETRON HCL 4 MG PO TABS: 4.0000 mg | ORAL_TABLET | ORAL | Status: DC | PRN

## 2011-08-08 MED ORDER — ACETAMINOPHEN 325 MG PO TABS: 650.0000 mg | ORAL_TABLET | ORAL | Status: DC | PRN

## 2011-08-08 MED ORDER — IBUPROFEN 600 MG PO TABS
600.0000 mg | ORAL_TABLET | Freq: Four times a day (QID) | ORAL | Status: DC
  Administered 2011-08-08 – 2011-08-09 (×5): 600 mg via ORAL
  Filled 2011-08-08 (×5): qty 1

## 2011-08-08 MED ORDER — OXYCODONE-ACETAMINOPHEN 5-325 MG PO TABS: 1.0000 | ORAL_TABLET | ORAL | Status: DC | PRN

## 2011-08-08 MED ORDER — LANOLIN HYDROUS EX OINT: TOPICAL_OINTMENT | CUTANEOUS | Status: DC | PRN

## 2011-08-08 MED ORDER — LACTATED RINGERS IV SOLN
INTRAVENOUS | Status: DC
  Administered 2011-08-08: 08:00:00 via INTRAVENOUS

## 2011-08-08 MED ORDER — FLEET ENEMA 7-19 GM/118ML RE ENEM: 1.0000 | ENEMA | RECTAL | Status: DC | PRN

## 2011-08-08 MED ORDER — DIBUCAINE 1 % RE OINT
1.0000 "application " | TOPICAL_OINTMENT | RECTAL | Status: DC | PRN
  Filled 2011-08-08: qty 28

## 2011-08-08 MED ORDER — SIMETHICONE 80 MG PO CHEW: 80.0000 mg | CHEWABLE_TABLET | ORAL | Status: DC | PRN

## 2011-08-08 MED ORDER — ONDANSETRON HCL 4 MG/2ML IJ SOLN: 4.0000 mg | Freq: Four times a day (QID) | INTRAMUSCULAR | Status: DC | PRN

## 2011-08-08 MED ORDER — OXYCODONE-ACETAMINOPHEN 5-325 MG PO TABS
1.0000 | ORAL_TABLET | ORAL | Status: DC | PRN
  Administered 2011-08-08: 1 via ORAL
  Filled 2011-08-08: qty 1

## 2011-08-08 MED ORDER — TETANUS-DIPHTH-ACELL PERTUSSIS 5-2.5-18.5 LF-MCG/0.5 IM SUSP
0.5000 mL | Freq: Once | INTRAMUSCULAR | Status: AC
  Administered 2011-08-09: 0.5 mL via INTRAMUSCULAR
  Filled 2011-08-08: qty 0.5

## 2011-08-08 NOTE — Progress Notes (Signed)
UR chart review completed.  

## 2011-08-08 NOTE — Progress Notes (Signed)
Pt up to bathroom at 0903.  Pt in bathroom, started having a trickle of bright red bleeding, and felt like baby was coming.  Pt escorted back to bed.  Resident and interpreter called to come for delivery, however neither made it to the room before infant delivered.

## 2011-08-08 NOTE — H&P (Signed)
[name] is a 31 y.o. female presenting for active labor. History  This is a G7P6 who presented in active labor and delivered precipitously but uneventfully, as documented in her delivery note.  While she had no regular prenatal care, during her [facility] visits she had prenatal labs as listed below.  Postpartum she would like to use an IUD for contraception.  She plans on using both formula and breastmilk for nutrition.  Other pertinent ROS was negative.  OB History    Grav Para Term Preterm Abortions TAB SAB Ect Mult Living   7 6 6  0 1 0 1 0 0 6     Past Medical History  Diagnosis Date  . No pertinent past medical history    Past Surgical History  Procedure Date  . No past surgeries    Family History: family history is negative for Anesthesia problems and Hearing loss. Social History:  reports that she has never smoked. She has never used smokeless tobacco. She reports that she does not drink alcohol or use illicit drugs.  ROS As per HPI  Dilation:  (svd) Effacement (%): 100 Station: +1 Exam by:: L. Lima, RN Blood pressure 130/86, pulse 82, temperature 97.4 F (36.3 C), temperature source Oral, resp. rate 18, height 5\' 1"  (1.549 m), weight 91.627 kg (202 lb), unknown if currently breastfeeding. Maternal Exam:  Abdomen: Fundal height is firm below the umbilicus.    Introitus: Normal vulva. Normal vagina.  Ferning test: not done.  Nitrazine test: not done. Amniotic fluid character: clear.  Pelvis: adequate for delivery.   Cervix: not evaluated. Deferred as patient had already delivered  Fetal Exam Fetal Monitor Review: Baseline rate: Patient had already delivered viable infant at time of exam..      Physical Exam  Constitutional: She is oriented to person, place, and time. She appears well-developed and well-nourished.  HENT:  Head: Normocephalic and atraumatic.  Eyes: Conjunctivae and EOM are normal. Right eye exhibits no discharge. Left eye exhibits  no discharge. No scleral icterus.  Neck: No tracheal deviation present.  Respiratory: Effort normal. No stridor. No respiratory distress.  GI: Soft. She exhibits no distension. There is no tenderness. There is no rebound and no guarding.  Genitourinary: Vagina normal.  Musculoskeletal: Normal range of motion.  Neurological: She is alert and oriented to person, place, and time.  Skin: Skin is warm and dry.  Psychiatric: She has a normal mood and affect. Her behavior is normal. Judgment and thought content normal.    Prenatal labs: ABO, Rh: --/--/O POS (05/13 1400) Antibody: NEG (05/13 1400) Rubella: 199.3 (05/13 1359) RPR: NON REACTIVE (05/13 1359)  HBsAg: NEGATIVE (05/13 1359)  HIV: Non-reactive (06/12 0000)  GBS: Negative (06/07 0000)   Assessment/Plan: S/p spontaneous vaginal delivery, precipitous. Routine postpartum care.  Clancy Gourd 08/08/2011, 10:23 AM   I have seen this patient and agree with the above resident's note.  LEFTWICH-KIRBY, Perla Echavarria Certified Nurse-Midwife

## 2011-08-09 MED ORDER — IBUPROFEN 600 MG PO TABS: 600.0000 mg | ORAL_TABLET | Freq: Four times a day (QID) | ORAL | Status: AC

## 2011-08-09 NOTE — Progress Notes (Signed)
[facility] Department of Clinical Social Work Documentation of Interpretation   I assisted _Tedra  SW__________________ with interpretation of _questions_____________________ for this patient. 

## 2011-08-09 NOTE — Progress Notes (Signed)
Post Partum Day 1 Subjective: no complaints, up ad lib, voiding and tolerating PO. Pt denies BM or flatus.  Pt denies sob, CP, LE edema or pain. Pt endorses mild pelvic tenderness.  Objective: Blood pressure 96/63, pulse 91, temperature 97.4 F (36.3 C), temperature source Oral, resp. rate 18, height 5\' 1"  (1.549 m), weight 91.627 kg (202 lb), SpO2 98.00%, unknown if currently breastfeeding.  Physical Exam:  General: alert, cooperative and no distress Lochia: appropriate, mild spotting Uterine Fundus: firm, below umbilicus Incision: n/a DVT Evaluation: No evidence of DVT seen on physical exam. Negative Homan's sign. No cords or calf tenderness. No significant calf/ankle edema.   Basename 08/08/11 0730  HGB 11.8*  HCT 37.1    Assessment/Plan: Discharge home Breast/bottle Contraception - IUD No circ    LOS: 1 day   Ricardo Jericho 08/09/2011, 7:46 AM    Patient seen and examined.  Agree with above note.  Levie Heritage, DO 08/09/2011 3:20 PM

## 2011-08-09 NOTE — Discharge Instructions (Signed)
Parto vaginal °Cuidados luego °(Vaginal Delivery, Care After) °· Cambie el apósito cada vez que vaya al baño.  °· Límpiese suavemente con un papel tisú durante su estadía en el hospital, y siempre hágalo desde adelante hacia atrás. Puede utilizar un rociador con agua caliente del grifo o el tisú descartable, o ambos.  °· Coloque el apósito y el tisú sucios en el cesto de basura del baño, en la bolsa plástica de color rojo.  °· Durante su permanencia en el hospital, guarde los coágulos. Si elimina un coágulo, por favor no tire la cadena. También, si su flujo vaginal le parece excesivo, notifíquelo al personal de enfermería.  °· La primera vez que se levante de la cama después del parto, espere la ayuda de la enfermera. No se levante sola en ningún momento si se siente débil o mareada.  °· Flexione y extienda los tobillos vigorosamente, de modo que pueda sentir que las pantorrillas se endurecen, media docena de veces por hora, cuando está en la cama y despierta.  °· No se siente sobre un pie suyo ni deje las piernas colgando del borde de la cama ni mantenga una posición que dificulte la circulación de las piernas.  °· Muchas mujeres experimentan dolores de entuerto (contracciones uterinas leves) en los dos o tres días después del parto. Pídale a la enfermera un medicamento contra el dolor si lo necesita. Algunas veces la alimentación a pecho estimula los "entuertos"; si le ocurre esto, pida la medicación ½ - ¾ de hora antes de la próxima vez que alimente al bebé.  °· Para su protección y la de su bebé, le pedimos que no traspase las puertas dobles de la entrada de la unidad de obstetricia. No lleve al bebé en brazos por el corredor. Cuando saque o entre al bebé de la habitación, por favor colóquelo en el cochecito y empújelo.  °· Las mamás pueden tener a sus bebés en la habitación todo lo que deseen.  °Document Released: 02/12/2005 Document Revised: 02/01/2011 °ExitCare® Patient Information ©2012 [employer]. °

## 2011-08-09 NOTE — Clinical Social Work Maternal (Signed)
    Clinical Social Work Department PSYCHOSOCIAL ASSESSMENT - MATERNAL/CHILD 08/09/2011  Patient:  [name], [name]  Account Number:  [id number]  Admit Date:  08/08/2011  [name] Name:   [name]    Clinical Social Worker:  Andy Gauss   Date/Time:  08/09/2011 10:30 AM  Date Referred:  08/09/2011   Referral source  CN     Referred reason  Other - See comment   Other referral source:    I:  FAMILY / HOME ENVIRONMENT Child's legal guardian:  PARENT  Guardian - Name Guardian - Age Guardian - Address  [name] 31 [address] [place], [place] [address]  [name] 39 (same as above)   Other household support members/support persons Other support:   Adult child    II  PSYCHOSOCIAL DATA Information Source:  Patient Interview  Event organiser Employment:   Surveyor, quantity resources:  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  She tried to schedule an appointment with [facility] but could not pay the $300 copay, required.  Pt later tried to be seen at the health department but was declined, as she was too far along.  She denies illegal substance use and verbalized understanding of hospital drug testing policy.  UDS is negative and meconium results are pending.  Pt expressed the need for additional clothing but has the other necessary supplies.  Sw provided  the pt with a bundle pack of clothing.  She has adequate family support and appears to be bonding well.  Sw will follow up with drug screen results and make referral if needed.      VI SOCIAL WORK PLAN Social Work Plan  No Further Intervention Required / No Barriers to Discharge   Type of pt/family education:   If child protective services report - county:   If child protective services report - date:   Information/referral to community resources comment:   Other social work plan:

## 2011-08-09 NOTE — Discharge Summary (Signed)
Obstetric Discharge Summary Reason for Admission: onset of labor Prenatal Procedures: none Intrapartum Procedures: spontaneous vaginal delivery Postpartum Procedures: none Complications-Operative and Postpartum: none Hemoglobin  Date Value Range Status  08/08/2011 11.8* 12.0 - 15.0 g/dL Final     HCT  Date Value Range Status  08/08/2011 37.1  36.0 - 46.0 % Final    Physical Exam:  General: alert, cooperative and no distress Lochia: appropriate Uterine Fundus: firm DVT Evaluation: No evidence of DVT seen on physical exam. Negative Homan's sign. No cords or calf tenderness. No significant calf/ankle edema.  Discharge Diagnoses: Term Pregnancy-delivered  Discharge Information: Date: 08/09/2011 Activity: pelvic rest Diet: routine Medications: PNV and Ibuprofen Condition: stable Instructions: refer to practice specific booklet Discharge to: home Follow-up Information    Follow up with [facility] in 4 weeks.   Contact information:   [address] [place] [address]          Newborn Data: Live born female  Birth Weight: 7 lb 5.3 oz (3325 g) APGAR: 9, 9  Home with mother.  Shaquala Broeker JEHIEL 08/09/2011, 9:06 AM

## 2011-08-15 ENCOUNTER — Encounter: Payer: Self-pay | Admitting: Advanced Practice Midwife

## 2011-09-12 ENCOUNTER — Ambulatory Visit: Payer: Self-pay | Admitting: Obstetrics & Gynecology

## 2011-10-01 ENCOUNTER — Ambulatory Visit: Payer: Self-pay | Admitting: Physician Assistant

## 2012-02-04 IMAGING — US US OB TRANSVAGINAL
1 series · 13 of 28 positions shown · non-contrast
Comparison: Prior ultrasound of pregnancy performed 11/15/2009

CLINICAL DATA: Pelvic pain and vaginal bleeding.

OBSTETRIC <14 WK US AND TRANSVAGINAL OB US
TECHNIQUE: Both transabdominal and transvaginal ultrasound
examinations were performed for complete evaluation of the
gestation as well as the maternal uterus, adnexal regions, and
pelvic cul-de-sac.  Transvaginal technique was performed to assess
early pregnancy.

[Series 1: us ob transvaginal · 0.26mm/px · 90 acquisitions, 13 frames shown]
[im 4/90]
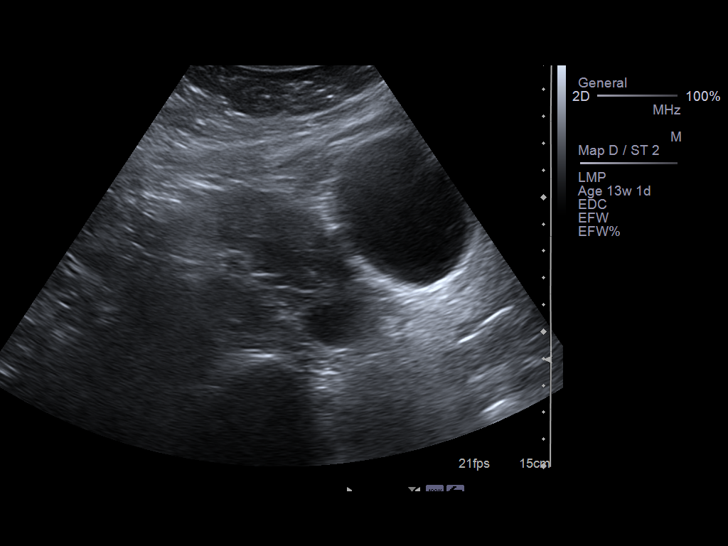
[im 10/90]
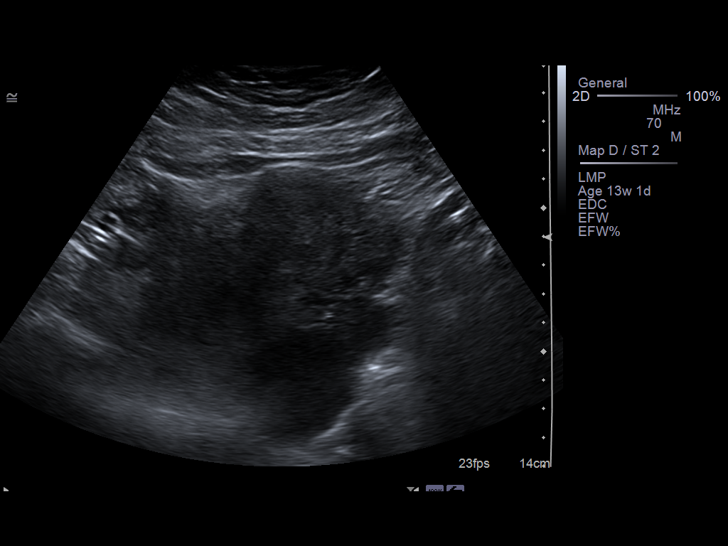
[im 17/90]
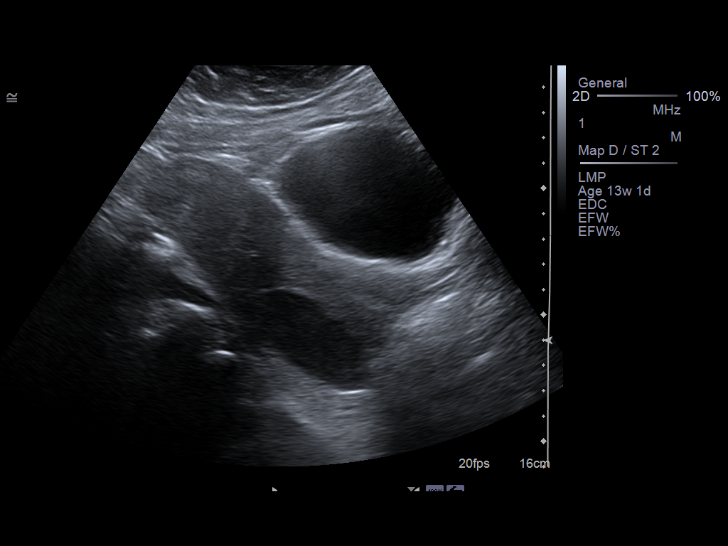
[im 24/90]
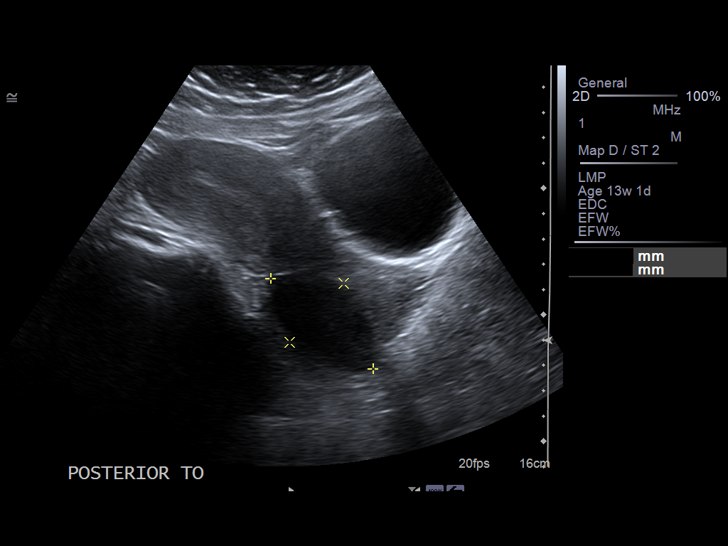
[im 30/90]
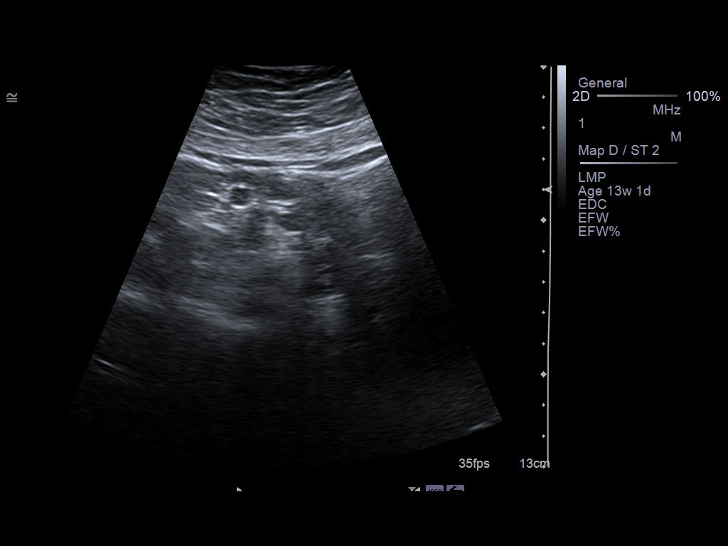
[im 37/90]
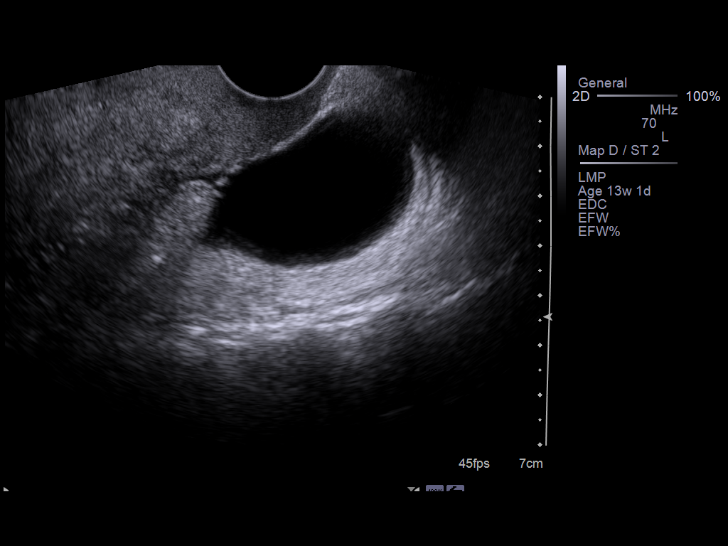
[im 47/90]
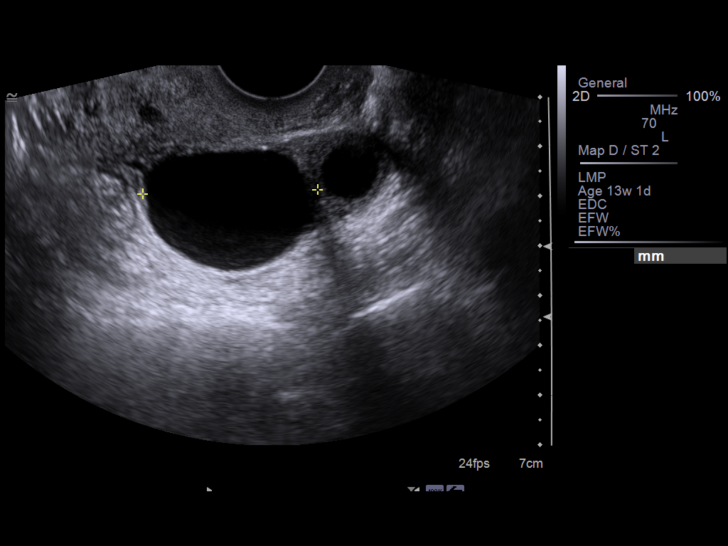
[im 53/90]
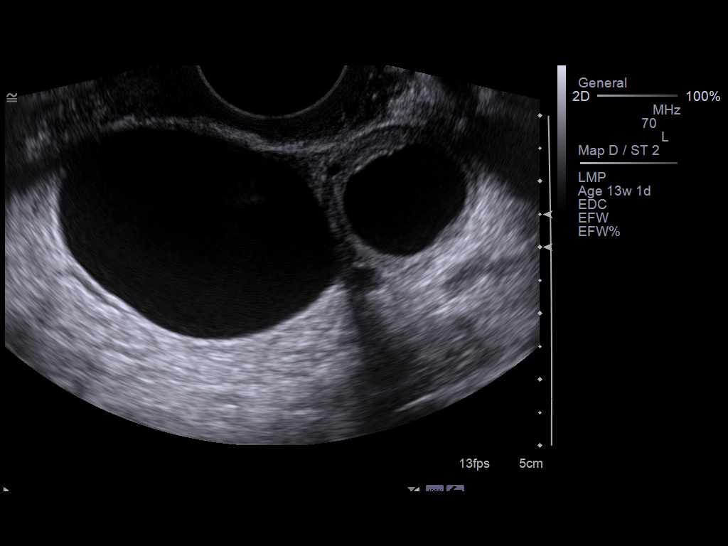
[im 60/90]
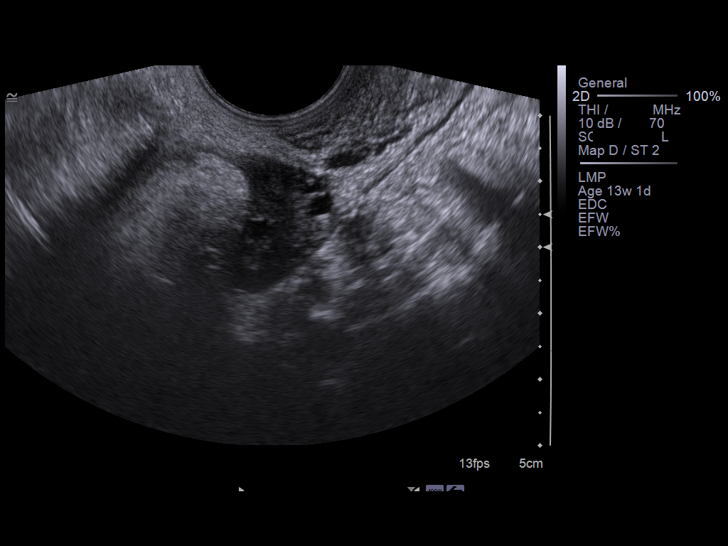
[im 66/90]
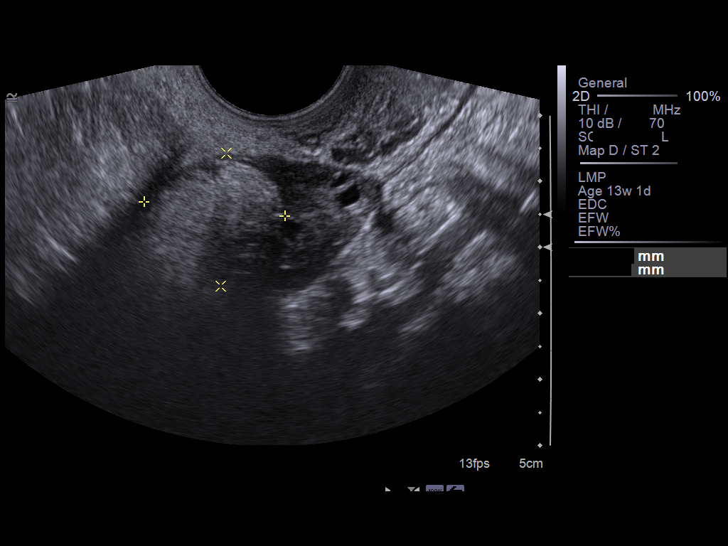
[im 73/90]
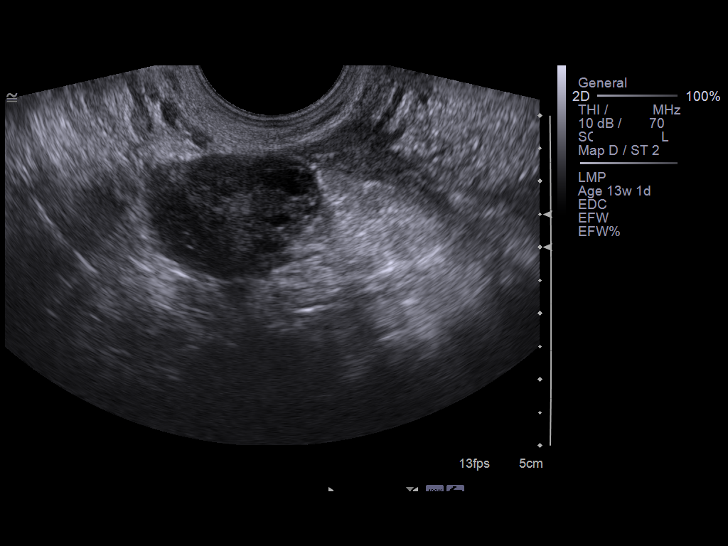
[im 80/90]
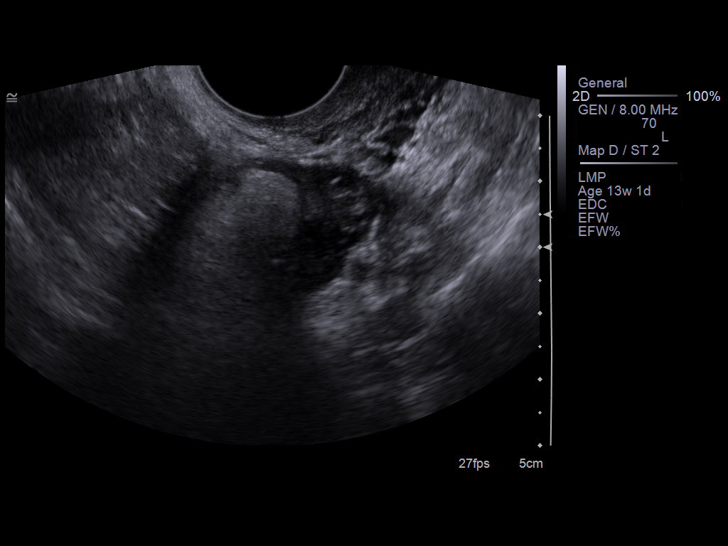
[im 86/90]
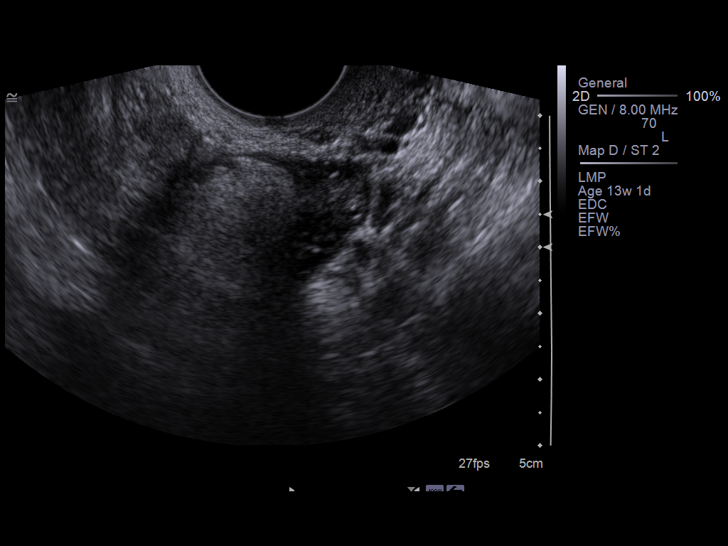

[13 of 28 positions shown; findings below may reference images not displayed]

Intrauterine gestational sac:  Not seen.
Yolk sac: N/A
Embryo: N/A
Cardiac Activity: N/A

MSD: N/A

Maternal uterus/adnexae:
The uterus measures 11.6 cm in length, 4.4 cm in AP dimension and
6.7 cm in transverse dimension.  The endometrial echo complex
measures 1.1 cm in thickness.  At the lower uterine segment, note
is made of a 0.7 x 0.4 cm collection of mildly complex fluid.  The
endometrial echo complex is otherwise unremarkable.

Two Nabothian cysts are noted, the larger of which measures 1.0 cm
in size.  The cervix otherwise unremarkable.

The right ovary measures 3.5 x 2.3 x 2.8 cm, while the left ovary
measures 6.5 x 3.3 x 2.8 cm. At the right ovary, there is a 2.1 cm
focus of increased echogenicity, which may reflect an ovarian
dermoid.  Two prominent essentially anechoic cysts are noted
arising from the left ovary; the larger of these cysts measures
cm in size.  Though this is likely physiologic, follow-up pelvic
ultrasound would be helpful to ensure resolution.

There is no definite evidence for ovarian torsion.

No free fluid is seen within the pelvic cul-de-sac.
IMPRESSION: 1.  No intrauterine gestational sac identified.  No definite
evidence for ectopic pregnancy.  Given the apparent trace mildly
complex fluid at the lower uterine segment, this could reflect a
spontaneous abortion; would follow b-HCG levels to ensure decline.
Alternatively, this could reflect a not yet visualized intrauterine
or ectopic pregnancy; if b-HCG levels continue to rise, would
perform follow-up ultrasound in 10 days.
2.  2.1 cm hyperechoic right ovarian lesion likely reflects an
ovarian dermoid.

3.  Two left ovarian cysts noted, the larger of which measures
cm.  Though this is likely physiologic, follow-up pelvic ultrasound
would be helpful to ensure resolution.
4.  Nabothian cysts noted.

## 2013-06-12 ENCOUNTER — Encounter (HOSPITAL_COMMUNITY): Payer: Self-pay | Admitting: Emergency Medicine

## 2013-06-12 ENCOUNTER — Emergency Department (HOSPITAL_COMMUNITY): Payer: Medicaid Other

## 2013-06-12 ENCOUNTER — Inpatient Hospital Stay (HOSPITAL_COMMUNITY)
Admission: AD | Admit: 2013-06-12 | Discharge: 2013-06-12 | Disposition: A | Discharge status: Home / Self Care | Payer: Self-pay | Source: Ambulatory Visit | Attending: Family Medicine | Admitting: Family Medicine

## 2013-06-12 DIAGNOSIS — O99891 Other specified diseases and conditions complicating pregnancy: Secondary | ICD-10-CM | POA: Insufficient documentation

## 2013-06-12 DIAGNOSIS — O26899 Other specified pregnancy related conditions, unspecified trimester: Secondary | ICD-10-CM

## 2013-06-12 DIAGNOSIS — R109 Unspecified abdominal pain: Secondary | ICD-10-CM | POA: Insufficient documentation

## 2013-06-12 DIAGNOSIS — O00109 Unspecified tubal pregnancy without intrauterine pregnancy: Secondary | ICD-10-CM | POA: Insufficient documentation

## 2013-06-12 DIAGNOSIS — O039 Complete or unspecified spontaneous abortion without complication: Secondary | ICD-10-CM | POA: Insufficient documentation

## 2013-06-12 DIAGNOSIS — O9989 Other specified diseases and conditions complicating pregnancy, childbirth and the puerperium: Secondary | ICD-10-CM

## 2013-06-12 LAB — CBC WITH DIFFERENTIAL/PLATELET
BASOS PCT: 0 % (ref 0–1)
Basophils Absolute: 0 10*3/uL (ref 0.0–0.1)
Eosinophils Absolute: 0.1 10*3/uL (ref 0.0–0.7)
Eosinophils Relative: 2 % (ref 0–5)
HEMATOCRIT: 36.4 % (ref 36.0–46.0)
HEMOGLOBIN: 12 g/dL (ref 12.0–15.0)
LYMPHS ABS: 2.7 10*3/uL (ref 0.7–4.0)
LYMPHS PCT: 32 % (ref 12–46)
MCH: 25.5 pg — ABNORMAL LOW (ref 26.0–34.0)
MCHC: 33 g/dL (ref 30.0–36.0)
MCV: 77.3 fL — ABNORMAL LOW (ref 78.0–100.0)
MONO ABS: 0.8 10*3/uL (ref 0.1–1.0)
MONOS PCT: 9 % (ref 3–12)
NEUTROS ABS: 4.9 10*3/uL (ref 1.7–7.7)
NEUTROS PCT: 57 % (ref 43–77)
Platelets: 302 10*3/uL (ref 150–400)
RBC: 4.71 MIL/uL (ref 3.87–5.11)
RDW: 15.3 % (ref 11.5–15.5)
WBC: 8.4 10*3/uL (ref 4.0–10.5)

## 2013-06-12 LAB — BASIC METABOLIC PANEL
BUN: 10 mg/dL (ref 6–23)
CHLORIDE: 105 meq/L (ref 96–112)
CO2: 23 meq/L (ref 19–32)
Calcium: 8.7 mg/dL (ref 8.4–10.5)
Creatinine, Ser: 0.68 mg/dL (ref 0.50–1.10)
GFR calc non Af Amer: >90 mL/min (ref >90)
Glucose, Bld: 90 mg/dL (ref 70–99)
POTASSIUM: 3.3 meq/L — AB (ref 3.7–5.3)
Sodium: 140 mEq/L (ref 137–147)

## 2013-06-12 LAB — URINALYSIS, ROUTINE W REFLEX MICROSCOPIC
Bilirubin Urine: NEGATIVE
GLUCOSE, UA: NEGATIVE mg/dL
HGB URINE DIPSTICK: NEGATIVE
Ketones, ur: NEGATIVE mg/dL
LEUKOCYTES UA: NEGATIVE
Nitrite: NEGATIVE
PH: 7 (ref 5.0–8.0)
Protein, ur: NEGATIVE mg/dL
SPECIFIC GRAVITY, URINE: 1.025 (ref 1.005–1.030)
Urobilinogen, UA: 1 mg/dL (ref 0.0–1.0)

## 2013-06-12 LAB — WET PREP, GENITAL
CLUE CELLS WET PREP: NONE SEEN
Trich, Wet Prep: NONE SEEN

## 2013-06-12 LAB — HCG, QUANTITATIVE, PREGNANCY: HCG, BETA CHAIN, QUANT, S: 1026 m[IU]/mL — AB (ref <5)

## 2013-06-12 LAB — POC URINE PREG, ED: PREG TEST UR: POSITIVE — AB

## 2013-06-12 NOTE — Discharge Instructions (Signed)
Embarazo ectópico °( Ectopic Pregnancy) °Un embarazo ectópico ocurre cuando un óvulo fecundado se desarrolla fuera del útero. Un embarazo no puede subsistir fuera del útero. Este problema generalmente ocurre en las trompas de Falopio. La causa es con frecuencia un daño en la trompa de Falopio. °Si el problema se detecta a tiempo, puede tratarse con medicamentos. Si el conducto se fisura o estalla (hay ruptura), tendrá una hemorragia interna. Esto es una emergencia. Deberá someterse a una cirugía. Solicite ayuda de inmediato.  °SÍNTOMAS °Al principio puede tener síntomas de un embarazo normal. Estas pueden ser: °· Falta del período menstrual. °· Ganas de vomitar (náuseas). °· Sensación de cansancio. °· Hinchazón de las mamas. °Luego comenzará a tener síntomas que no son normales. Estas pueden ser: °· Dolor durante las relaciones sexuales. °· Hemorragia por la vagina. Esto incluye hemorragia leve (manchas). °· Calambres o dolor en la zona inferior del vientre (abdomen). Estos pueden sentirse en uno de los lados. °· Latidos cardíacos rápidos (pulso). °· Perder la conciencia (desmayos) después de defecar. °Si el conducto se rompe, puede tener síntomas como: °· Dolor muy intenso en el vientre o parte baja del vientre. Esto se produce repentinamente. °· Mareos. °· Desmayo. °· Dolor en el hombro. °SOLICITE AYUDA DE INMEDIATO SI:  °Tiene síntomas de embarazo ectópico. Esto es una emergencia. °Document Released: 02/01/2011 Document Revised: 12/03/2012 °ExitCare® Patient Information ©2014 [employer]. ° °

## 2013-06-12 NOTE — ED Provider Notes (Signed)
CSN: [id number]     Arrival date & time 06/12/13  1544 History   First MD Initiated Contact with Patient 06/12/13 1607     Chief Complaint  Patient presents with  . Possible Pregnancy     (Consider location/radiation/quality/duration/timing/severity/associated sxs/prior Treatment) HPI Comments: Patient presents to the ED with a chief complaint of abdominal cramping.  She states that she was [place] clinic [place] [facility], and was diagnosed with a possible ectopic pregnancy.  An US was performed at the clinic and she was [facility], however, the patient lives in [place], so she presented [facility] now.  She complains of left sided abdominal cramping.  She denies any additional symptoms.  She states that the pain is moderate.  The history is provided by the patient. No language interpreter was used.    Past Medical History  Diagnosis Date  . No pertinent past medical history    Past Surgical History  Procedure Laterality Date  . No past surgeries     Family History  Problem Relation Age of Onset  . Anesthesia problems Neg Hx   . Hearing loss Neg Hx    History  Substance Use Topics  . Smoking status: Never Smoker   . Smokeless tobacco: Never Used  . Alcohol Use: No   OB History   Grav Para Term Preterm Abortions TAB SAB Ect Mult Living   7 6 6  0 1 0 1 0 0 6     Review of Systems  Constitutional: Negative for fever and chills.  Respiratory: Negative for shortness of breath.   Cardiovascular: Negative for chest pain.  Gastrointestinal: Positive for abdominal pain. Negative for nausea, vomiting, diarrhea and constipation.  Genitourinary: Negative for dysuria.      Allergies  Review of patient's allergies indicates no known allergies.  Home Medications   Prior to Admission medications   Medication Sig Start Date End Date Taking? Authorizing Provider  Prenatal Vit-Fe Fumarate-FA (PRENATAL MULTIVITAMIN) TABS Take 1 tablet by mouth daily.     Historical Provider, MD   BP 144/73  Pulse 104  Temp(Src) 98.1 F (36.7 C)  Resp 18  Wt 190 lb (86.183 kg)  SpO2 100% Physical Exam  Nursing note and vitals reviewed. Constitutional: She is oriented to person, place, and time. She appears well-developed and well-nourished.  HENT:  Head: Normocephalic and atraumatic.  Eyes: Conjunctivae and EOM are normal. Pupils are equal, round, and reactive to light.  Neck: Normal range of motion. Neck supple.  Cardiovascular: Normal rate and regular rhythm.  Exam reveals no gallop and no friction rub.   No murmur heard. Pulmonary/Chest: Effort normal and breath sounds normal. No respiratory distress. She has no wheezes. She has no rales. She exhibits no tenderness.  Abdominal: Soft. Bowel sounds are normal. She exhibits no distension and no mass. There is no tenderness. There is no rebound and no guarding.  Genitourinary:  Pelvic exam chaperoned by female ER tech, remarkable for left adnexal tenderness, no right adnexal tenderness, no uterine tenderness, no vaginal discharge or bleeding, no CMT or friability, no foreign body, no injury to the external genitalia, no other significant findings   Musculoskeletal: Normal range of motion. She exhibits no edema and no tenderness.  Neurological: She is alert and oriented to person, place, and time.  Skin: Skin is warm and dry.  Psychiatric: She has a normal mood and affect. Her behavior is normal. Judgment and thought content normal.    ED  Course  Procedures (including critical care time) Results for orders placed during the hospital encounter of 06/12/13  WET PREP, GENITAL      Result Value Ref Range   Yeast Wet Prep HPF POC RARE YEAST (*) NONE SEEN   Trich, Wet Prep NONE SEEN  NONE SEEN   Clue Cells Wet Prep HPF POC NONE SEEN  NONE SEEN   WBC, Wet Prep HPF POC TOO NUMEROUS TO COUNT (*) NONE SEEN  URINALYSIS, ROUTINE W REFLEX MICROSCOPIC      Result Value Ref Range   Color, Urine YELLOW  YELLOW    APPearance CLEAR  CLEAR   Specific Gravity, Urine 1.025  1.005 - 1.030   pH 7.0  5.0 - 8.0   Glucose, UA NEGATIVE  NEGATIVE mg/dL   Hgb urine dipstick NEGATIVE  NEGATIVE   Bilirubin Urine NEGATIVE  NEGATIVE   Ketones, ur NEGATIVE  NEGATIVE mg/dL   Protein, ur NEGATIVE  NEGATIVE mg/dL   Urobilinogen, UA 1.0  0.0 - 1.0 mg/dL   Nitrite NEGATIVE  NEGATIVE   Leukocytes, UA NEGATIVE  NEGATIVE  CBC WITH DIFFERENTIAL      Result Value Ref Range   WBC 8.4  4.0 - 10.5 K/uL   RBC 4.71  3.87 - 5.11 MIL/uL   Hemoglobin 12.0  12.0 - 15.0 g/dL   HCT 13.236.4  44.036.0 - 10.246.0 %   MCV 77.3 (*) 78.0 - 100.0 fL   MCH 25.5 (*) 26.0 - 34.0 pg   MCHC 33.0  30.0 - 36.0 g/dL   RDW 72.515.3  36.611.5 - 44.015.5 %   Platelets 302  150 - 400 K/uL   Neutrophils Relative % 57  43 - 77 %   Neutro Abs 4.9  1.7 - 7.7 K/uL   Lymphocytes Relative 32  12 - 46 %   Lymphs Abs 2.7  0.7 - 4.0 K/uL   Monocytes Relative 9  3 - 12 %   Monocytes Absolute 0.8  0.1 - 1.0 K/uL   Eosinophils Relative 2  0 - 5 %   Eosinophils Absolute 0.1  0.0 - 0.7 K/uL   Basophils Relative 0  0 - 1 %   Basophils Absolute 0.0  0.0 - 0.1 K/uL  BASIC METABOLIC PANEL      Result Value Ref Range   Sodium 140  137 - 147 mEq/L   Potassium 3.3 (*) 3.7 - 5.3 mEq/L   Chloride 105  96 - 112 mEq/L   CO2 23  19 - 32 mEq/L   Glucose, Bld 90  70 - 99 mg/dL   BUN 10  6 - 23 mg/dL   Creatinine, Ser 3.470.68  0.50 - 1.10 mg/dL   Calcium 8.7  8.4 - 42.510.5 mg/dL   GFR calc non Af Amer >90  >90 mL/min   GFR calc Af Amer >90  >90 mL/min  HCG, QUANTITATIVE, PREGNANCY      Result Value Ref Range   hCG, Beta Chain, Quant, S 1026 (*) <5 mIU/mL  POC URINE PREG, ED      Result Value Ref Range   Preg Test, Ur POSITIVE (*) NEGATIVE   [place] Ob Transvaginal  06/12/2013   CLINICAL DATA:  Pelvic pain.  Positive urine pregnancy test.  EXAM: TRANSVAGINAL OB ULTRASOUND  TECHNIQUE: Transvaginal ultrasound was performed for complete evaluation of the gestation as well as the maternal  uterus, adnexal regions, and pelvic cul-de-sac.  COMPARISON:  US OB COMP +14 WK dated 07/09/2011  FINDINGS: Intrauterine gestational sac:  Mildly complex fluid within a 1.9 x 3.8 x 1.6 cm endometrial fluid collection with somewhat irregular margins. Thickened adjacent endometrium approximately 1.8 cm.  Yolk sac:  Not visualize  Embryo:  Not visualized  Cardiac Activity: Not applicable  Maternal uterus/adnexae: Nonvisualization of the right ovary. Limited visualization of the left ovary. No adnexal mass is seen. No free pelvic fluid observed.  IMPRESSION: 1. Irregular and elongated complex fluid collection in the endometrial cavity, mildly thickened surrounding endometrium, and nonvisualization of yolk sac or embryo. The ovaries were particularly difficult to visualize, with nonvisualization of the right ovary an only partial visualization of the left ovary. Differential diagnostic considerations include spontaneous abortion and occult ectopic pregnancy with pseudogestational sac. Appearance is not likely to be compatible with successful pregnancy. Careful correlation with quantitative beta HCG trend and a low threshold for ultrasound reimaging is recommended.   Electronically Signed   By: Herbie Baltimore M.D.   On: 06/12/2013 18:03     Imaging Review No results found.   EKG Interpretation None      MDM   Final diagnoses:  Ectopic pregnancy    Patient with possible ectopic pregnancy.  US performed today, which shows:  (+) gestational sac (-) IUP (+) Left adnexal cyst with possible yolk sac concerning for ectopic Incidental 6.6x4.2 cm simple cyst  US performed at [facility] Patient seen by Dr. Jolyn Lent.  Pager: [phone]  8:57 PM Patient discussed with Dr. Deretha Emory, who recommends consulting OBGYN after repeat US here.   Discussed the patient with Dr. Shawnie Pons from [facility], who request transfer.   Roxy Horseman, PA-C 06/12/13 2059

## 2013-06-12 NOTE — ED Notes (Signed)
Pt was [facility] for further evaluation of possible ectopic pregnancy.  Pt reports no pain but mild cramping.

## 2013-06-12 NOTE — MAU Note (Addendum)
Patient complains of minimal pain and states it is the same pain she has been experiencing for the last two weeks.

## 2013-06-12 NOTE — MAU Provider Note (Signed)
History     CSN: [id number]  Arrival date and time: 06/12/13 1544   First Provider Initiated Contact with Patient 06/12/13 2247      Chief Complaint  Patient presents with  . Possible Pregnancy   HPI [name] is a 33 y.o. [id number] at 6074w3d who presents to MAU [facility] management of possible ectopic pregnancy. The patient states that she has mild lower abdominal pain rates at 3/10 now. She states that it has been constant x 2 weeks and unchanged. She denies vaginal bleeding, N/V or fever.   OB History   Grav Para Term Preterm Abortions TAB SAB Ect Mult Living   8 6 6  0 1 0 1 0 0 6      Past Medical History  Diagnosis Date  . No pertinent past medical history     Past Surgical History  Procedure Laterality Date  . No past surgeries      Family History  Problem Relation Age of Onset  . Anesthesia problems Neg Hx   . Hearing loss Neg Hx     History  Substance Use Topics  . Smoking status: Never Smoker   . Smokeless tobacco: Never Used  . Alcohol Use: No    Allergies: No Known Allergies  Prescriptions prior to admission  Medication Sig Dispense Refill  . Prenatal Vit-Fe Fumarate-FA (PRENATAL MULTIVITAMIN) TABS Take 1 tablet by mouth daily.        Review of Systems  Constitutional: Negative for fever and malaise/fatigue.  Gastrointestinal: Positive for abdominal pain. Negative for nausea, vomiting, diarrhea and constipation.  Genitourinary:       Neg - vaginal bleeding   Physical Exam   Blood pressure 127/65, pulse 106, temperature 99 F (37.2 C), temperature source Oral, resp. rate 18, weight 86.183 kg (190 lb), last menstrual period 04/21/2013, SpO2 100.00%, unknown if currently breastfeeding.  Physical Exam  Constitutional: She is oriented to person, place, and time. She appears well-developed and well-nourished. No distress.  HENT:  Head: Normocephalic and atraumatic.  Cardiovascular: Normal rate.   Respiratory:  Effort normal.  GI: Soft. She exhibits no distension and no mass. There is no tenderness. There is no rebound and no guarding.  Neurological: She is alert and oriented to person, place, and time.  Skin: Skin is warm and dry. No erythema.  Psychiatric: She has a normal mood and affect.   Results for orders placed during the hospital encounter of 06/12/13 (from the past 24 hour(s))  CBC WITH DIFFERENTIAL     Status: Abnormal   Collection Time    06/12/13  4:18 PM      Result Value Ref Range   WBC 8.4  4.0 - 10.5 K/uL   RBC 4.71  3.87 - 5.11 MIL/uL   Hemoglobin 12.0  12.0 - 15.0 g/dL   HCT 69.636.4  29.536.0 - 28.446.0 %   MCV 77.3 (*) 78.0 - 100.0 fL   MCH 25.5 (*) 26.0 - 34.0 pg   MCHC 33.0  30.0 - 36.0 g/dL   RDW 13.215.3  44.011.5 - 10.215.5 %   Platelets 302  150 - 400 K/uL   Neutrophils Relative % 57  43 - 77 %   Neutro Abs 4.9  1.7 - 7.7 K/uL   Lymphocytes Relative 32  12 - 46 %   Lymphs Abs 2.7  0.7 - 4.0 K/uL   Monocytes Relative 9  3 - 12 %   Monocytes Absolute 0.8  0.1 -  1.0 K/uL   Eosinophils Relative 2  0 - 5 %   Eosinophils Absolute 0.1  0.0 - 0.7 K/uL   Basophils Relative 0  0 - 1 %   Basophils Absolute 0.0  0.0 - 0.1 K/uL  BASIC METABOLIC PANEL     Status: Abnormal   Collection Time    06/12/13  4:18 PM      Result Value Ref Range   Sodium 140  137 - 147 mEq/L   Potassium 3.3 (*) 3.7 - 5.3 mEq/L   Chloride 105  96 - 112 mEq/L   CO2 23  19 - 32 mEq/L   Glucose, Bld 90  70 - 99 mg/dL   BUN 10  6 - 23 mg/dL   Creatinine, Ser 4.090.68  0.50 - 1.10 mg/dL   Calcium 8.7  8.4 - 81.110.5 mg/dL   GFR calc non Af Amer >90  >90 mL/min   GFR calc Af Amer >90  >90 mL/min  HCG, QUANTITATIVE, PREGNANCY     Status: Abnormal   Collection Time    06/12/13  4:18 PM      Result Value Ref Range   hCG, Beta Chain, Quant, S 1026 (*) <5 mIU/mL  URINALYSIS, ROUTINE W REFLEX MICROSCOPIC     Status: None   Collection Time    06/12/13  4:19 PM      Result Value Ref Range   Color, Urine YELLOW  YELLOW    APPearance CLEAR  CLEAR   Specific Gravity, Urine 1.025  1.005 - 1.030   pH 7.0  5.0 - 8.0   Glucose, UA NEGATIVE  NEGATIVE mg/dL   Hgb urine dipstick NEGATIVE  NEGATIVE   Bilirubin Urine NEGATIVE  NEGATIVE   Ketones, ur NEGATIVE  NEGATIVE mg/dL   Protein, ur NEGATIVE  NEGATIVE mg/dL   Urobilinogen, UA 1.0  0.0 - 1.0 mg/dL   Nitrite NEGATIVE  NEGATIVE   Leukocytes, UA NEGATIVE  NEGATIVE  POC URINE PREG, ED     Status: Abnormal   Collection Time    06/12/13  4:26 PM      Result Value Ref Range   Preg Test, Ur POSITIVE (*) NEGATIVE  WET PREP, GENITAL     Status: Abnormal   Collection Time    06/12/13  7:05 PM      Result Value Ref Range   Yeast Wet Prep HPF POC RARE YEAST (*) NONE SEEN   Trich, Wet Prep NONE SEEN  NONE SEEN   Clue Cells Wet Prep HPF POC NONE SEEN  NONE SEEN   WBC, Wet Prep HPF POC TOO NUMEROUS TO COUNT (*) NONE SEEN   [place] Ob Transvaginal  06/12/2013   CLINICAL DATA:  Pelvic pain.  Positive urine pregnancy test.  EXAM: TRANSVAGINAL OB ULTRASOUND  TECHNIQUE: Transvaginal ultrasound was performed for complete evaluation of the gestation as well as the maternal uterus, adnexal regions, and pelvic cul-de-sac.  COMPARISON:  US OB COMP +14 WK dated 07/09/2011  FINDINGS: Intrauterine gestational sac: Mildly complex fluid within a 1.9 x 3.8 x 1.6 cm endometrial fluid collection with somewhat irregular margins. Thickened adjacent endometrium approximately 1.8 cm.  Yolk sac:  Not visualize  Embryo:  Not visualized  Cardiac Activity: Not applicable  Maternal uterus/adnexae: Nonvisualization of the right ovary. Limited visualization of the left ovary. No adnexal mass is seen. No free pelvic fluid observed.  IMPRESSION: 1. Irregular and elongated complex fluid collection in the endometrial cavity, mildly thickened surrounding endometrium, and nonvisualization of yolk sac  or embryo. The ovaries were particularly difficult to visualize, with nonvisualization of the right ovary an only partial  visualization of the left ovary. Differential diagnostic considerations include spontaneous abortion and occult ectopic pregnancy with pseudogestational sac. Appearance is not likely to be compatible with successful pregnancy. Careful correlation with quantitative beta HCG trend and a low threshold for ultrasound reimaging is recommended.   Electronically Signed   By: Herbie Baltimore M.D.   On: 06/12/2013 18:03    MAU Course  Procedures None  MDM Labs and US performed at [facility]. ED MD consulted with Dr. Shawnie Pons. ED MD was not comfortable counseling patient on her options, so patient was brought here for management. Discussed at length patient's options. Although this was not a planned pregnancy, the patient does not feel comfortable with MTX today and would prefer to wait for 48 hours for follow-up labs before determining a plan.   Assessment and Plan  A: Abdominal pain in pregnancy, antepartum  P: Discharge home Ectopic/bleeding precautions discussed Patient advised to return to MAU in 48 hours for follow-up labs Patient advised to return to MAU sooner if she develops worsening pain, vaginal bleeding, fever or excessive N/V  Freddi Starr, PA-C  06/12/2013, 10:47 PM

## 2013-06-12 NOTE — ED Notes (Signed)
[facility] pregnancy diagnosed with ultrasound in the office today. She denies any complaints.

## 2013-06-12 NOTE — ED Notes (Signed)
Pt transported to US with claudia hooker, EMT as chaperone.

## 2013-06-12 NOTE — MAU Note (Signed)
Patient transferred [name] via [facility] for ectopic management.

## 2013-06-13 LAB — GC/CHLAMYDIA PROBE AMP
CT PROBE, AMP APTIMA: NEGATIVE
GC Probe RNA: NEGATIVE

## 2013-06-13 NOTE — MAU Provider Note (Signed)
Attestation of Attending Supervision of Advanced Practitioner (PA/CNM/NP): Evaluation and management procedures were performed by the Advanced Practitioner under my supervision and collaboration.  I have reviewed the Advanced Practitioner's note and chart, and I agree with the management and plan.  Reva Boresanya S Anijah Spohr, MD [facility] Attending 06/13/2013 7:26 AM

## 2013-06-14 ENCOUNTER — Inpatient Hospital Stay (HOSPITAL_COMMUNITY)
Admission: AD | Admit: 2013-06-14 | Discharge: 2013-06-14 | Disposition: A | Discharge status: Home / Self Care | Payer: Medicaid Other | Source: Ambulatory Visit | Attending: Obstetrics & Gynecology | Admitting: Obstetrics & Gynecology

## 2013-06-14 DIAGNOSIS — O26899 Other specified pregnancy related conditions, unspecified trimester: Secondary | ICD-10-CM

## 2013-06-14 DIAGNOSIS — O9989 Other specified diseases and conditions complicating pregnancy, childbirth and the puerperium: Secondary | ICD-10-CM

## 2013-06-14 DIAGNOSIS — O99891 Other specified diseases and conditions complicating pregnancy: Secondary | ICD-10-CM | POA: Insufficient documentation

## 2013-06-14 DIAGNOSIS — R109 Unspecified abdominal pain: Secondary | ICD-10-CM | POA: Insufficient documentation

## 2013-06-14 DIAGNOSIS — O0281 Inappropriate change in quantitative human chorionic gonadotropin (hCG) in early pregnancy: Secondary | ICD-10-CM | POA: Insufficient documentation

## 2013-06-14 LAB — HCG, QUANTITATIVE, PREGNANCY: hCG, Beta Chain, Quant, S: 1582 m[IU]/mL — ABNORMAL HIGH (ref <5)

## 2013-06-14 NOTE — MAU Note (Signed)
Pt presents to MAU for repeat HCG. Denies any pain or vaginal bleeding

## 2013-06-14 NOTE — ED Provider Notes (Signed)
Medical screening examination/treatment/procedure(s) were conducted as a shared visit with non-physician practitioner(s) and myself.  I personally evaluated the patient during the encounter.   EKG Interpretation None      Results for orders placed during the hospital encounter of 06/12/13  WET PREP, GENITAL      Result Value Ref Range   Yeast Wet Prep HPF POC RARE YEAST (*) NONE SEEN   Trich, Wet Prep NONE SEEN  NONE SEEN   Clue Cells Wet Prep HPF POC NONE SEEN  NONE SEEN   WBC, Wet Prep HPF POC TOO NUMEROUS TO COUNT (*) NONE SEEN  GC/CHLAMYDIA PROBE AMP      Result Value Ref Range   CT Probe RNA NEGATIVE  NEGATIVE   GC Probe RNA NEGATIVE  NEGATIVE  URINALYSIS, ROUTINE W REFLEX MICROSCOPIC      Result Value Ref Range   Color, Urine YELLOW  YELLOW   APPearance CLEAR  CLEAR   Specific Gravity, Urine 1.025  1.005 - 1.030   pH 7.0  5.0 - 8.0   Glucose, UA NEGATIVE  NEGATIVE mg/dL   Hgb urine dipstick NEGATIVE  NEGATIVE   Bilirubin Urine NEGATIVE  NEGATIVE   Ketones, ur NEGATIVE  NEGATIVE mg/dL   Protein, ur NEGATIVE  NEGATIVE mg/dL   Urobilinogen, UA 1.0  0.0 - 1.0 mg/dL   Nitrite NEGATIVE  NEGATIVE   Leukocytes, UA NEGATIVE  NEGATIVE  CBC WITH DIFFERENTIAL      Result Value Ref Range   WBC 8.4  4.0 - 10.5 K/uL   RBC 4.71  3.87 - 5.11 MIL/uL   Hemoglobin 12.0  12.0 - 15.0 g/dL   HCT 16.136.4  09.636.0 - 04.546.0 %   MCV 77.3 (*) 78.0 - 100.0 fL   MCH 25.5 (*) 26.0 - 34.0 pg   MCHC 33.0  30.0 - 36.0 g/dL   RDW 40.915.3  81.111.5 - 91.415.5 %   Platelets 302  150 - 400 K/uL   Neutrophils Relative % 57  43 - 77 %   Neutro Abs 4.9  1.7 - 7.7 K/uL   Lymphocytes Relative 32  12 - 46 %   Lymphs Abs 2.7  0.7 - 4.0 K/uL   Monocytes Relative 9  3 - 12 %   Monocytes Absolute 0.8  0.1 - 1.0 K/uL   Eosinophils Relative 2  0 - 5 %   Eosinophils Absolute 0.1  0.0 - 0.7 K/uL   Basophils Relative 0  0 - 1 %   Basophils Absolute 0.0  0.0 - 0.1 K/uL  BASIC METABOLIC PANEL      Result Value Ref Range   Sodium  140  137 - 147 mEq/L   Potassium 3.3 (*) 3.7 - 5.3 mEq/L   Chloride 105  96 - 112 mEq/L   CO2 23  19 - 32 mEq/L   Glucose, Bld 90  70 - 99 mg/dL   BUN 10  6 - 23 mg/dL   Creatinine, Ser 7.820.68  0.50 - 1.10 mg/dL   Calcium 8.7  8.4 - 95.610.5 mg/dL   GFR calc non Af Amer >90  >90 mL/min   GFR calc Af Amer >90  >90 mL/min  HCG, QUANTITATIVE, PREGNANCY      Result Value Ref Range   hCG, Beta Chain, Quant, S 1026 (*) <5 mIU/mL  POC URINE PREG, ED      Result Value Ref Range   Preg Test, Ur POSITIVE (*) NEGATIVE   [facility] [place]. Patient was being evaluated for elective AB. Baby did ultrasound did not see an intrauterine pregnancy concerns were raised for an ectopic pregnancy. Patient was referred to a local hospital but since [place] lives in [place] came here. Ultrasound was repeated along with a quantitative hCG which was only at 1000 approximately. Discuss with GYN over a [facility] hospital. They want her transferred over there for further evaluation. Transfer range. Patient without the vaginal bleeding and no significant pain. Patient hemodynamically stable while in the emergency department. Patient is approximately [redacted] weeks pregnant by dates.  Shelda JakesScott W. Kierrah Kilbride, MD 06/14/13 [id number]

## 2013-06-14 NOTE — Discharge Instructions (Signed)
Embarazo ectpico ( Ectopic Pregnancy) Un embarazo ectpico ocurre cuando un vulo fecundado se desarrolla fuera del tero. Un embarazo no puede subsistir fuera del tero. Este problema generalmente ocurre en las trompas de [place]. La causa es con frecuencia un dao en la trompa de [place]. Si el problema se detecta a tiempo, puede tratarse con medicamentos. Si el conducto se fisura o estalla (hay ruptura), tendr Neomia Dearuna hemorragia interna. Esto es Radio broadcast assistantuna emergencia. Deber someterse a [place] ciruga. Solicite ayuda de inmediato.  SNTOMAS Al principio puede tener sntomas de un embarazo normal. Estas pueden ser:  Falta del perodo menstrual.  Ganas de vomitar (nuseas).  Sensacin de cansancio.  Hinchazn de las mamas. Luego comenzar a tener sntomas que no son normales. Estas pueden ser:  Product/process development scientistDolor durante las relaciones sexuales.  Hemorragia por la vagina. Esto incluye hemorragia leve (manchas).  Calambres o dolor en la zona inferior del vientre (abdomen). Estos pueden sentirse en uno de los lados.  Latidos cardacos rpidos (pulso).  Perder la conciencia (desmayos) despus de defecar. Si el conducto se rompe, puede tener sntomas como:  Dolor muy intenso en el vientre o parte baja del vientre. Esto se produce repentinamente.  Mareos.  Desmayo.  Dolor en el hombro. SOLICITE AYUDA DE INMEDIATO SI:  Tiene sntomas de embarazo ectpico. Esto es una emergencia. Document Released: 02/01/2011 Document Revised: 12/03/2012 [facility] Patient Information 2014 [place].

## 2013-06-14 NOTE — MAU Provider Note (Signed)
[name] is a 33 y.o. [phone] at 5129w5d who presents to MAU today for follow-up quant hCG. [facility] and per ED note US showed a possible ectopic pregnancy. [facility] and no IUP or ectopic was seen. Quant hCG was ~ 1000. The patient was having mild to moderate lower abdominal pain. She was sent here for management discussion. Today the patient denies pain, bleeding or fever.   BP 128/83  Pulse 89  Resp 18  LMP 04/21/2013 GENERAL: Well-developed, well-nourished female in no acute distress.  HEENT: Normocephalic, atraumatic.   LUNGS: Effort normal HEART: Regular rate  SKIN: Warm, dry and without erythema PSYCH: Normal mood and affect  Results for [name], Apoorva (MRN [id number]) as of 06/14/2013 19:22  Ref. Range 06/12/2013 16:18 06/12/2013 16:19 06/12/2013 16:26 06/12/2013 17:48 06/12/2013 19:05 06/14/2013 18:20  hCG, Beta Chain, Quant, S Latest Range: <5 mIU/mL 1026 (H)     1582 (H)   MDM Discussed patient and lab results with Dr. Erin FullingHarraway-Smith. Patient has option for MTX today as this is unlikely a normal pregnancy or follow-up labs in 48 hours with precautions.  Discussed options with the patient. She would like to wait 48 hours for follow-up again. Importance of follow-up and ectopic precautions discussed  A: Inappropriate rise in quant hCG  P: Discharge home Ectopic precautions discussed Patient to return to MAU on 06/16/13 for follow-up labs Patient may return to MAU as needed sooner of if her condition were to change or worsen  Freddi StarrJulie N Ethier, PA-C 06/14/2013 7:26 PM

## 2013-06-16 ENCOUNTER — Encounter (HOSPITAL_COMMUNITY): Payer: Self-pay | Admitting: *Deleted

## 2013-06-16 ENCOUNTER — Inpatient Hospital Stay (HOSPITAL_COMMUNITY)
Admission: AD | Admit: 2013-06-16 | Discharge: 2013-06-16 | Disposition: A | Discharge status: Home / Self Care | Payer: Self-pay | Source: Ambulatory Visit | Attending: Family Medicine | Admitting: Family Medicine

## 2013-06-16 ENCOUNTER — Inpatient Hospital Stay (HOSPITAL_COMMUNITY): Payer: Medicaid Other

## 2013-06-16 DIAGNOSIS — O00109 Unspecified tubal pregnancy without intrauterine pregnancy: Secondary | ICD-10-CM | POA: Insufficient documentation

## 2013-06-16 DIAGNOSIS — O209 Hemorrhage in early pregnancy, unspecified: Secondary | ICD-10-CM | POA: Insufficient documentation

## 2013-06-16 DIAGNOSIS — O009 Unspecified ectopic pregnancy without intrauterine pregnancy: Secondary | ICD-10-CM

## 2013-06-16 LAB — CBC WITH DIFFERENTIAL/PLATELET
Basophils Absolute: 0 10*3/uL (ref 0.0–0.1)
Basophils Relative: 0 % (ref 0–1)
EOS PCT: 1 % (ref 0–5)
Eosinophils Absolute: 0.2 10*3/uL (ref 0.0–0.7)
HEMATOCRIT: 35.2 % — AB (ref 36.0–46.0)
Hemoglobin: 11.6 g/dL — ABNORMAL LOW (ref 12.0–15.0)
LYMPHS ABS: 2.9 10*3/uL (ref 0.7–4.0)
Lymphocytes Relative: 28 % (ref 12–46)
MCH: 25.3 pg — AB (ref 26.0–34.0)
MCHC: 33 g/dL (ref 30.0–36.0)
MCV: 76.9 fL — AB (ref 78.0–100.0)
Monocytes Absolute: 0.9 10*3/uL (ref 0.1–1.0)
Monocytes Relative: 9 % (ref 3–12)
Neutro Abs: 6.5 10*3/uL (ref 1.7–7.7)
Neutrophils Relative %: 62 % (ref 43–77)
Platelets: 281 10*3/uL (ref 150–400)
RBC: 4.58 MIL/uL (ref 3.87–5.11)
RDW: 15.4 % (ref 11.5–15.5)
WBC: 10.5 10*3/uL (ref 4.0–10.5)

## 2013-06-16 LAB — CREATININE, SERUM
Creatinine, Ser: 0.67 mg/dL (ref 0.50–1.10)
GFR calc Af Amer: >90 mL/min (ref >90)

## 2013-06-16 LAB — HCG, QUANTITATIVE, PREGNANCY: hCG, Beta Chain, Quant, S: 2122 m[IU]/mL — ABNORMAL HIGH (ref <5)

## 2013-06-16 LAB — BUN: BUN: 9 mg/dL (ref 6–23)

## 2013-06-16 LAB — AST: AST: 12 U/L (ref 0–37)

## 2013-06-16 MED ORDER — METHOTREXATE INJECTION FOR WOMEN'S HOSPITAL
50.0000 mg/m2 | Freq: Once | INTRAMUSCULAR | Status: AC
  Administered 2013-06-16: 95 mg via INTRAMUSCULAR
  Filled 2013-06-16: qty 1.9

## 2013-06-16 NOTE — MAU Provider Note (Signed)
Attestation of Attending Supervision of Advanced Practitioner (CNM/NP): Evaluation and management procedures were performed by the Advanced Practitioner under my supervision and collaboration.  I have reviewed the Advanced Practitioner's note and chart, and I agree with the management and plan.  Kylan Veach Harraway-Smith 3:36 PM     

## 2013-06-16 NOTE — Discharge Instructions (Signed)
Embarazo ectópico °( Ectopic Pregnancy) °Un embarazo ectópico se produce cuando el óvulo fecundado se adhiere (implanta) fuera del útero. La mayoría de los embarazos ectópicos se producen en la trompa de Falopio. Es raro que ocurran en el ovario, el intestino, la pelvis o el cuello del útero. En un embarazo ectópico, el óvulo fertilizado no tiene la capacidad de llegar a ser un bebé normal y sano.  °Una ruptura en el embarazo ectópico se produce cuando la trompa de Falopio se desgarra o se rompe y da como resultado una hemorragia interna. A menudo hay un intenso dolor abdominal y en algunos casos sangrado vaginal. Tener un embarazo ectópico puede ser una experiencia que pone en peligro la vida. Si no se trata, esta afección es muy grave y puede requerir una transfusión de sangre, cirugía abdominal, o incluso causar la muerte. °CAUSAS  °En la mayoría de los casos se sospecha un daño en las trompas de Falopio como causa del embarazo ectópico.  °FACTORES DE RIESGO °Dependiendo de sus circunstancias, el nivel de riesgo de tener un embarazo ectópico variará. El nivel de riesgo puede dividirse en tres categorías. °Alto Riesgo °· Usted ha realizado tratamientos para la infertilidad. °· Ha tenido un embarazo ectópico previo. °· Fue sometida a una cirugía de trompas. °· Tuvo una cirugía previa para ligar las trompas de Falopio (ligadura de trompas). °· Tiene problemas o enfermedades en las trompas. °· Ha estado expuesta al DES. El DES es un medicamento que se utilizó hasta 1971 y tuvo efectos en los bebés cuyas madres lo tomaron. °· Queda embarazada mientras usa un dispositivo intrauterino (DIU) como método anticonceptivo. °Riesgo moderado °· Tiene antecedentes de infertilidad. °· Ha sufrido alguna enfermedad de transmisión sexual (ETS). °· Tiene antecedentes de enfermedad pélvica inflamatoria (EPI). °· Tiene cicatrices por endometriosis. °· Tiene múltiples parejas sexuales. °· Fuma. °Bajo riesgo °· Fue sometida a una  cirugía pélvica. °· Usa duchas vaginales. °· Comenzó a ser sexualmente activa antes de los 18 años de edad. °SIGNOS Y SÍNTOMAS  °El embarazo ectópico se debe sospechar en cualquier mujer a la que le ha faltado un período y tiene dolor abdominal o sangrado. °· Puede experimentar síntomas normales de embarazo, tales como: °· Náuseas. °· Cansancio. °· Inflamación mamaria. °· Otros síntomas son: °· Dolor durante las relaciones sexuales. °· Hemorragia vaginal o manchado irregular. °· Cólicos o dolor en uno de los lados o en la zona inferior del abdomen. °· Latidos cardíacos acelerados. °· Desmayarse al defecar. °· Los síntomas de un embarazo ectópico con ruptura y hemorragias internas son: °· Dolor intenso y súbito en el abdomen y la pelvis. °· Mareos o desmayos. °· Dolor en la zona del hombro. °DIAGNÓSTICO  °Los exámenes que se indicarán son: °· Test de embarazo. °· Una ecografía. °· Prueba de nivel específico de hormona del embarazo en el torrente sanguíneo. °· Extracción de una muestra de tejido del útero (dilatación y curetaje, D y C). °· Cirugía para realizar un examen visual del interior del abdomen usando un tubo delgado, que emite luz con una pequeña cámara en el extremo (laparoscopio). °TRATAMIENTO  °Se podrá aplicar una inyección de un medicamento denominado metotrexato. Este medicamento hace que se absorba el tejido del embarazo. Se administra en los siguientes casos: °· Hay un diagnóstico temprano. °· La trompa de Falopio no se ha roto. °· Se la considera una buena candidata para recibir el medicamento. °Por lo general, después del tratamiento con metotrexato se comprueban los niveles de hormonas del embarazo. Esto se   hace para asegurarse de que el medicamento es efectivo. Puede llevar entre cuatro y seis semanas para que el embarazo sea absorbido (aunque la mayoría de los embarazos se absorberá a tres semanas). °Puede ser necesario realizar un tratamiento quirúrgico. Se puede utilizar un laparoscopio para  retirar el tejido del embarazo. Si hay una hemorragia interna grave, le harán un corte (incisión) en la zona inferior del abdomen (laparotomía), y se extirpará el embarazo ectópico. De este modo de detendrá el sangrado. Es posible que también se extirpe una parte de la trompa de Falopio, o toda la trompa (salpingectomía). Luego de la cirugía, le harán un análisis de la hormona del embarazo para asegurarse de que no quedan tejidos. Quizás reciba la vacuna de inmunoglobulina Rho(D) si usted es Rh negativa y el padre es Rh positivo, o si no conoce el tipo de Rh del padre. Esto evita problemas en próximos embarazos. °SOLICITE ATENCIÓN MÉDICA DE INMEDIATO SI:  °Tiene síntomas de embarazo ectópico. Esto es una emergencia médica. °Document Released: 02/12/2005 Document Revised: 12/03/2012 °ExitCare® Patient Information ©2014 [employer]. ° °Tratamiento con metotrexato para el embarazo ectópico °(Methotrexate Treatment for an Ectopic Pregnancy) °El embarazo ectópico se produce cuando el huevo fertilizado se adhiere (implanta) fuera del útero. La mayoría de los embarazos ectópicos se producen en la trompa de Falopio. Es raro que ocurran en el ovario, el intestino, la pelvis o el cuello uterino. Un embarazo ectópico no tiene la capacidad de llegar a ser un bebé normal y sano. Puede ser una experiencia que pone en peligro la vida. Sin embargo, si se diagnostica precozmente, puede tratarse con un medicamento. Este medicamento es el metotrexato. El metotrexato actúa eficientemente impidiendo que el embarazo siga. Ayuda al organismo a absorber el tejido del embarazo en un período de 2 a 6 semanas (aunque la mayoría de los embarazos serán absorbidos aproximadamente a las 3 semanas).  °Si el metotrexato es efectivo, hay una buena probabilidad de que la trompa de Falopio puede salvarse. Independientemente de si la trompa de Falopio puede salvarse, una madre que ha tenido un embarazo ectópico tiene un riesgo mucho mayor de tener  otro embarazo ectópico en los embarazos futuros. Una seria preocupación es el desgarro potencial de la trompa de Falopio (ruptura). Si esto ocurre, es necesario realizar una cirugía de emergencia para remover el embarazo, y el metotrexato no se puede utilizar.  °La paciente ideal para recibir el metotrexato es aquella que:  °· No sufra una hemorragia interna. °· No sienta dolor abdominal intenso o persistente. °· Se haya comprometido a seguir adelante con las pruebas de laboratorio y concurrir a los controles hasta que el embarazo ectópico se haya absorbido. °· Sea sana y tenga las funciones hepáticas y renales normales en el momento de la evaluación. °El metotrexato no debe indicarse en mujeres que:  °· Están amamantando. °· Tener un embarazo normal (embarazo intrauterino). °· Sufrir enfermedades hepáticas, pulmonares o renales. °· Tienen problemas sanguíneos. °· Son alérgicas al metotrexato. °· Sufren de úlcera péptica. °· Tener un embarazo ectópico mayor de 1 ½ pulgadas (3,5 cm) o en el que haya latidos cardíacos fetales. Esta es una norma que se sigue la mayor parte del tiempo (contraindicación relativa). °ANTES DEL TRATAMIENTO  °Antes de administrar el medicamento: °· Le realizarán pruebas hepáticas, renales y análisis de sangre completo. °· Los análisis de sangre se realizan para medir los niveles hormonales de embarazo y para determinar el tipo sanguíneo de la madre. °· Si la mujer es Rh negativa y el padre   es Rh positivo o su Rh es desconocido, le administrarán una inyección de RhoGAM. °TRATAMIENTO °Hay dos formas que puede indicar el médico para usar el metotrexato. Una forma consiste en una sola dosis o inyección del medicamento. La otra forma consiste en una serie de dosis. Este método implica la aplicación de varias inyecciones. Los análisis de sangre se tomarán durante varias semanas para comprobar los niveles de la hormona del embarazo. Los análisis de sangre se realizan hasta que no se detecta más  hormona del embarazo en la sangre.  °DESPUÉS DEL TRATAMIENTO  °Durante algunas semanas le harán análisis de sangre para controlar los niveles de hormonas del embarazo. Los análisis se indican hasta que no se detecte más hormonas del embarazo en la sangre. También existe riesgo de ruptura del embarazo ectópico cuando se utiliza el metotrexato. Este medicamento también puede causar efectos adversos, que son:  °· Náuseas y vómitos. °· Llagas en la boca. °· Diarrea. °· Erupción. °· Mareos. °· Mayor dolor abdominal. °· Aumento de la hemorragia vaginal. °· Neumonía. °· Fracaso del tratamiento. °· Pérdida del cabello (raro y reversible). °En muy raras ocasiones puede afectar el recuento de glóbulos rojos, el hígado, la médula o los niveles de hormonas. Si esto sucede, el profesional necesitará realizarle otras evaluaciones.  °Document Released: 11/22/2004 Document Revised: 05/07/2011 °ExitCare® Patient Information ©2014 [employer]. ° °

## 2013-06-16 NOTE — MAU Note (Signed)
Pt reports spotting in mornings but denies pain.

## 2013-06-16 NOTE — MAU Provider Note (Signed)
HPI:  [name] is a 33 y.o. female [id number] at [id number] who presents for follow up beta hcg level. [facility] showed possible [facility]. [facility] showed possible gestational sac. She returned on 4/19 for beta hcg which did not rise appropriately. The patient was offered MTX and she declined. She opted to come back one additional time to see if the beta hcg would rise. Currently the patient denies pain, reports small amount of bleeding.  After much discussion, this is not a desired pregnancy, however the patient would like an US to determine ectopic vs. Miscarriage in process.   Beta hcg levels: 4/17: 1026 4/19: 1582 4/21: 2122  Objective:  GENERAL: Well-developed, well-nourished female in no acute distress.  HEENT: Normocephalic, atraumatic.   LUNGS: Effort normal HEART: Regular rate  SKIN: Warm, dry and without erythema PSYCH: Normal mood and affect  Filed Vitals:   06/16/13 1917  BP: 128/62  Pulse: 90  Temp: 98.6 F (37 C)  Resp: 16    Results for orders placed during the hospital encounter of 06/16/13 (from the past 48 hour(s))  HCG, QUANTITATIVE, PREGNANCY     Status: Abnormal   Collection Time    06/16/13  6:50 PM      Result Value Ref Range   hCG, Beta Chain, Quant, S 2122 (*) <5 mIU/mL   Comment:              GEST. AGE      CONC.  (mIU/mL)       <=1 WEEK        5 - 50         2 WEEKS       50 - 500         3 WEEKS       100 - 10,000         4 WEEKS     1,000 - 30,000         5 WEEKS     3,500 - 115,000       6-8 WEEKS     12,000 - 270,000        12 WEEKS     15,000 - 220,000                FEMALE AND NON-PREGNANT FEMALE:         LESS THAN 5 mIU/mL   [place] Ob Comp Less 14 Wks  06/16/2013   CLINICAL DATA:  Followup  EXAM: OBSTETRIC <14 WK US AND TRANSVAGINAL OB US  TECHNIQUE: Both transabdominal and transvaginal ultrasound examinations were performed for  complete evaluation of the gestation as well as the maternal uterus, adnexal regions, and pelvic cul-de-sac. Transvaginal technique was performed to assess early pregnancy.  COMPARISON:  US OB COMP LESS 14 WK dated 06/12/2013; US OB COMP LESS 14 WK dated 07/19/2010  FINDINGS: Intrauterine gestational sac: No gestational sac is identified. An oblong irregular fluid collection in the endometrial canal is present. Decidual cast surrounding the fluid collection is noted.  Yolk sac:  Not visualized.  Embryo:  Not visualized.  Cardiac Activity: Not applicable.  Heart Rate:  Not applicable. Bpm  Maternal uterus/adnexae: A left ovary is within normal limits. Adjacent to the left ovary, there is a large 6.3 x 5.7 x 6.4 cm simple paraovarian cyst. Within the right ovary, there is a 2.1 cm hyperechoic lesion which is stable  compared with 07/19/2010 compatible with a dermoid. No ectopic gestational sac, tubal ring, or embryo can be identified  IMPRESSION: Abnormal findings are again noted. There is no gestational sac. Only an irregular fluid collection with decidual cast is noted. Differential diagnosis includes ectopic pregnancy and spontaneous abortion.   Electronically Signed   By: Angela BeanArt  English M.D.   On: 06/16/2013 20:43   [place] Ob Transvaginal  06/16/2013   CLINICAL DATA:  Followup  EXAM: OBSTETRIC <14 WK US AND TRANSVAGINAL OB US  TECHNIQUE: Both transabdominal and transvaginal ultrasound examinations were performed for complete evaluation of the gestation as well as the maternal uterus, adnexal regions, and pelvic cul-de-sac. Transvaginal technique was performed to assess early pregnancy.  COMPARISON:  US OB COMP LESS 14 WK dated 06/12/2013; US OB COMP LESS 14 WK dated 07/19/2010  FINDINGS: Intrauterine gestational sac: No gestational sac is identified. An oblong irregular fluid collection in the endometrial canal is present. Decidual cast surrounding the fluid collection is noted.  Yolk sac:  Not visualized.  Embryo:  Not  visualized.  Cardiac Activity: Not applicable.  Heart Rate:  Not applicable. Bpm  Maternal uterus/adnexae: A left ovary is within normal limits. Adjacent to the left ovary, there is a large 6.3 x 5.7 x 6.4 cm simple paraovarian cyst. 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 No ectopic gestational sac, tubal ring, or embryo can be identified  IMPRESSION: Abnormal findings are again noted. There is no gestational sac. Only an irregular fluid collection with decidual cast is noted. Differential diagnosis includes ectopic pregnancy and spontaneous abortion.   Electronically Signed   By: Angela BeanArt  English M.D.   On: 06/16/2013 20:43    MDM Consulted with Dr. Shawnie English; will repeat US  US report discussed with Dr. Shawnie English; pt is a candidate for MTX Spanish interpretor at bedside to discuss US findings and suspicion for ectopic pregnancy; pt is a candidate for MTX; risks discussed and patient accepts MTX injection MTX ordered; dosing per pharmacy Report given to [name] CNM who resumes care of the patient 2110.    MTX given.    A:  High suspicion for ectopic pregnancy; beta hcg 2122 with no intrauterine gestational sac Vaginal bleeding in pregnancy F/U instructions and ectopic precautions [facility] Follow-up Information   [facility] On 06/19/2013. (repeat bloodwork or sooner , As needed, If symptoms worsen)    Contact information:   [address] [id number] [place] [id number] [phone]       Medication List    ASK your doctor about these medications       prenatal multivitamin Tabs tablet  Take 1 tablet by mouth daily.       [name], [place] 06/16/2013 11:38 PM

## 2013-06-17 NOTE — MAU Provider Note (Signed)
Attestation of Attending Supervision of Advanced Practitioner (PA/CNM/NP): Evaluation and management procedures were performed by the Advanced Practitioner under my supervision and collaboration.  I have reviewed the Advanced Practitioner's note and chart, and I agree with the management and plan.  Reva Boresanya S Darrin Koman, MD [facility] Attending 06/17/2013 1:41 AM

## 2013-06-19 ENCOUNTER — Inpatient Hospital Stay (HOSPITAL_COMMUNITY)
Admission: AD | Admit: 2013-06-19 | Discharge: 2013-06-19 | Disposition: A | Discharge status: Home / Self Care | Payer: Self-pay | Source: Ambulatory Visit | Attending: Obstetrics & Gynecology | Admitting: Obstetrics & Gynecology

## 2013-06-19 DIAGNOSIS — O009 Unspecified ectopic pregnancy without intrauterine pregnancy: Secondary | ICD-10-CM

## 2013-06-19 DIAGNOSIS — O00109 Unspecified tubal pregnancy without intrauterine pregnancy: Secondary | ICD-10-CM | POA: Insufficient documentation

## 2013-06-19 LAB — HCG, QUANTITATIVE, PREGNANCY: hCG, Beta Chain, Quant, S: 3193 m[IU]/mL — ABNORMAL HIGH

## 2013-06-19 NOTE — Discharge Instructions (Signed)
Embarazo ectópico  ( Ectopic Pregnancy)  Un embarazo ectópico se produce cuando el óvulo fecundado se adhiere (implanta) fuera del útero. La mayoría de los embarazos ectópicos se producen en la trompa de Falopio. Es raro que ocurran en el ovario, el intestino, la pelvis o el cuello del útero. En un embarazo ectópico, el óvulo fertilizado no tiene la capacidad de llegar a ser un bebé normal y sano.   Una ruptura en el embarazo ectópico se produce cuando la trompa de Falopio se desgarra o se rompe y da como resultado una hemorragia interna. A menudo hay un intenso dolor abdominal y en algunos casos sangrado vaginal. Tener un embarazo ectópico puede ser una experiencia que pone en peligro la vida. Si no se trata, esta afección es muy grave y puede requerir una transfusión de sangre, cirugía abdominal, o incluso causar la muerte.  CAUSAS   En la mayoría de los casos se sospecha un daño en las trompas de Falopio como causa del embarazo ectópico.   FACTORES DE RIESGO  Dependiendo de sus circunstancias, el nivel de riesgo de tener un embarazo ectópico variará. El nivel de riesgo puede dividirse en tres categorías.  Alto Riesgo  · Usted ha realizado tratamientos para la infertilidad.  · Ha tenido un embarazo ectópico previo.  · Fue sometida a una cirugía de trompas.  · Tuvo una cirugía previa para ligar las trompas de Falopio (ligadura de trompas).  · Tiene problemas o enfermedades en las trompas.  · Ha estado expuesta al DES. El DES es un medicamento que se utilizó hasta 1971 y tuvo efectos en los bebés cuyas madres lo tomaron.  · Queda embarazada mientras usa un dispositivo intrauterino (DIU) como método anticonceptivo.  Riesgo moderado  · Tiene antecedentes de infertilidad.  · Ha sufrido alguna enfermedad de transmisión sexual (ETS).  · Tiene antecedentes de enfermedad pélvica inflamatoria (EPI).  · Tiene cicatrices por endometriosis.  · Tiene múltiples parejas sexuales.  · Fuma.  Bajo riesgo  · Fue sometida a una  cirugía pélvica.  · Usa duchas vaginales.  · Comenzó a ser sexualmente activa antes de los 18 años de edad.  SIGNOS Y SÍNTOMAS   El embarazo ectópico se debe sospechar en cualquier mujer a la que le ha faltado un período y tiene dolor abdominal o sangrado.  · Puede experimentar síntomas normales de embarazo, tales como:  · Náuseas.  · Cansancio.  · Inflamación mamaria.  · Otros síntomas son:  · Dolor durante las relaciones sexuales.  · Hemorragia vaginal o manchado irregular.  · Cólicos o dolor en uno de los lados o en la zona inferior del abdomen.  · Latidos cardíacos acelerados.  · Desmayarse al defecar.  · Los síntomas de un embarazo ectópico con ruptura y hemorragias internas son:  · Dolor intenso y súbito en el abdomen y la pelvis.  · Mareos o desmayos.  · Dolor en la zona del hombro.  DIAGNÓSTICO   Los exámenes que se indicarán son:  · Test de embarazo.  · Una ecografía.  · Prueba de nivel específico de hormona del embarazo en el torrente sanguíneo.  · Extracción de una muestra de tejido del útero (dilatación y curetaje, D y C).  · Cirugía para realizar un examen visual del interior del abdomen usando un tubo delgado, que emite luz con una pequeña cámara en el extremo (laparoscopio).  TRATAMIENTO   Se podrá aplicar una inyección de un medicamento denominado metotrexato. Este medicamento hace que se absorba el tejido   del embarazo. Se administra en los siguientes casos:  · Hay un diagnóstico temprano.  · La trompa de Falopio no se ha roto.  · Se la considera una buena candidata para recibir el medicamento.  Por lo general, después del tratamiento con metotrexato se comprueban los niveles de hormonas del embarazo. Esto se hace para asegurarse de que el medicamento es efectivo. Puede llevar entre cuatro y seis semanas para que el embarazo sea absorbido (aunque la mayoría de los embarazos se absorberá a tres semanas).  Puede ser necesario realizar un tratamiento quirúrgico. Se puede utilizar un laparoscopio para  retirar el tejido del embarazo. Si hay una hemorragia interna grave, le harán un corte (incisión) en la zona inferior del abdomen (laparotomía), y se extirpará el embarazo ectópico. De este modo de detendrá el sangrado. Es posible que también se extirpe una parte de la trompa de Falopio, o toda la trompa (salpingectomía). Luego de la cirugía, le harán un análisis de la hormona del embarazo para asegurarse de que no quedan tejidos. Quizás reciba la vacuna de inmunoglobulina Rho(D) si usted es Rh negativa y el padre es Rh positivo, o si no conoce el tipo de Rh del padre. Esto evita problemas en próximos embarazos.  SOLICITE ATENCIÓN MÉDICA DE INMEDIATO SI:   Tiene síntomas de embarazo ectópico. Esto es una emergencia médica.  Document Released: 02/12/2005 Document Revised: 12/03/2012  ExitCare® Patient Information ©2014 [employer].

## 2013-06-19 NOTE — MAU Provider Note (Signed)
  History     CSN: [id number]  Arrival date and time: 06/19/13 1811   None     Chief Complaint  Patient presents with  . Follow-up   HPI  [name] is a 33 y.o. [id number] who is here for day #4 FU S/P MTX. She had MTX on 06/16/13. She denies any pain or bleeding at this time.   Past Medical History  Diagnosis Date  . No pertinent past medical history     Past Surgical History  Procedure Laterality Date  . No past surgeries      Family History  Problem Relation Age of Onset  . Anesthesia problems Neg Hx   . Hearing loss Neg Hx     History  Substance Use Topics  . Smoking status: Never Smoker   . Smokeless tobacco: Never Used  . Alcohol Use: No    Allergies: No Known Allergies  Prescriptions prior to admission  Medication Sig Dispense Refill  . Prenatal Vit-Fe Fumarate-FA (PRENATAL MULTIVITAMIN) TABS Take 1 tablet by mouth daily.        ROS Physical Exam   Blood pressure 127/67, pulse 96, temperature 98.4 F (36.9 C), temperature source Oral, resp. rate 16, last menstrual period 04/21/2013, unknown if currently breastfeeding.  Physical Exam  Nursing note and vitals reviewed. Constitutional: She is oriented to person, place, and time. She appears well-developed and well-nourished. No distress.  Cardiovascular: Normal rate.   Respiratory: Effort normal.  GI: Soft. There is no tenderness.  Neurological: She is alert and oriented to person, place, and time.  Skin: Skin is warm and dry.  Psychiatric: She has a normal mood and affect.    MAU Course  Procedures  Results for [name], Gilberto (MRN [id number]) as of 06/19/2013 18:53  Ref. Range 06/16/2013 18:50 06/16/2013 20:28 06/16/2013 21:40 06/19/2013 18:22  hCG, Beta Chain, Quant, S Latest Range: <5 mIU/mL 2122 (H)   3193 (H)    Assessment and Plan  Day #4 S/P MTX  Return to Tuesday 06/23/13 for day #7 labs Ectopic precautions reviewed   Tawnya CrookHeather Donovan Tahje Borawski 06/19/2013, 6:53 PM

## 2013-06-19 NOTE — MAU Note (Signed)
Here for post MTX day 4.  Feeling ok, no pain or bleeding.

## 2013-06-24 ENCOUNTER — Inpatient Hospital Stay (HOSPITAL_COMMUNITY)
Admission: AD | Admit: 2013-06-24 | Discharge: 2013-06-24 | Disposition: A | Discharge status: Home / Self Care | Payer: Self-pay | Source: Ambulatory Visit | Attending: Obstetrics and Gynecology | Admitting: Obstetrics and Gynecology

## 2013-06-24 ENCOUNTER — Encounter (HOSPITAL_COMMUNITY): Payer: Self-pay | Admitting: *Deleted

## 2013-06-24 DIAGNOSIS — O009 Unspecified ectopic pregnancy without intrauterine pregnancy: Secondary | ICD-10-CM

## 2013-06-24 DIAGNOSIS — O00109 Unspecified tubal pregnancy without intrauterine pregnancy: Secondary | ICD-10-CM | POA: Insufficient documentation

## 2013-06-24 LAB — BASIC METABOLIC PANEL
BUN: 11 mg/dL (ref 6–23)
CALCIUM: 9 mg/dL (ref 8.4–10.5)
CO2: 23 meq/L (ref 19–32)
Chloride: 103 mEq/L (ref 96–112)
Creatinine, Ser: 0.46 mg/dL — ABNORMAL LOW (ref 0.50–1.10)
GFR calc Af Amer: >90 mL/min (ref >90)
Glucose, Bld: 76 mg/dL (ref 70–99)
POTASSIUM: 3.7 meq/L (ref 3.7–5.3)
SODIUM: 140 meq/L (ref 137–147)

## 2013-06-24 LAB — CBC WITH DIFFERENTIAL/PLATELET
BASOS ABS: 0 10*3/uL (ref 0.0–0.1)
Basophils Relative: 0 % (ref 0–1)
EOS PCT: 2 % (ref 0–5)
Eosinophils Absolute: 0.2 10*3/uL (ref 0.0–0.7)
HCT: 35.3 % — ABNORMAL LOW (ref 36.0–46.0)
Hemoglobin: 11.5 g/dL — ABNORMAL LOW (ref 12.0–15.0)
Lymphocytes Relative: 33 % (ref 12–46)
Lymphs Abs: 2.8 10*3/uL (ref 0.7–4.0)
MCH: 25.6 pg — ABNORMAL LOW (ref 26.0–34.0)
MCHC: 32.6 g/dL (ref 30.0–36.0)
MCV: 78.6 fL (ref 78.0–100.0)
Monocytes Absolute: 0.7 10*3/uL (ref 0.1–1.0)
Monocytes Relative: 9 % (ref 3–12)
Neutro Abs: 4.7 10*3/uL (ref 1.7–7.7)
Neutrophils Relative %: 56 % (ref 43–77)
Platelets: 275 10*3/uL (ref 150–400)
RBC: 4.49 MIL/uL (ref 3.87–5.11)
RDW: 15.7 % — AB (ref 11.5–15.5)
WBC: 8.3 10*3/uL (ref 4.0–10.5)

## 2013-06-24 LAB — HEPATIC FUNCTION PANEL
ALBUMIN: 3.3 g/dL — AB (ref 3.5–5.2)
ALK PHOS: 106 U/L (ref 39–117)
ALT: 13 U/L (ref 0–35)
AST: 12 U/L (ref 0–37)
Bilirubin, Direct: <0.2 mg/dL (ref 0.0–0.3)
TOTAL PROTEIN: 6.8 g/dL (ref 6.0–8.3)
Total Bilirubin: <0.2 mg/dL — ABNORMAL LOW (ref 0.3–1.2)

## 2013-06-24 LAB — HCG, QUANTITATIVE, PREGNANCY: hCG, Beta Chain, Quant, S: 2737 m[IU]/mL — ABNORMAL HIGH (ref <5)

## 2013-06-24 MED ORDER — METHOTREXATE INJECTION FOR WOMEN'S HOSPITAL
50.0000 mg/m2 | Freq: Once | INTRAMUSCULAR | Status: AC
  Administered 2013-06-24: 95 mg via INTRAMUSCULAR
  Filled 2013-06-24: qty 1.9

## 2013-06-24 NOTE — MAU Note (Signed)
Here for follow up , denies bleeding or pain.

## 2013-06-24 NOTE — Discharge Instructions (Signed)
Tratamiento con metotrexato para Firefighterel embarazo ectpico (Methotrexate Treatment for an Ectopic Pregnancy) El Multimedia programmerembarazo ectpico se produce cuando el huevo fertilizado se adhiere (implanta) fuera del tero. La mayora de los embarazos ectpicos se producen en la trompa de [place]. Es raro que ocurran en el ovario, el intestino, la pelvis o el cuello uterino. Un embarazo ectpico no tiene la capacidad de llegar a ser un beb normal y sano. Puede ser Neomia Dearuna experiencia que pone en peligro la vida. Sin embargo, si se diagnostica precozmente, puede tratarse con un medicamento. Este medicamento es Air cabin crewel metotrexato. El metotrexato acta eficientemente impidiendo que el embarazo siga. Ayuda al organismo a absorber el tejido del Psychiatristembarazo en un perodo de 2 a 6 semanas (aunque la mayora de los embarazos sern absorbidos aproximadamente a las 3 semanas).  Si el metotrexato es Dorchesterefectivo, hay una buena probabilidad de que la trompa de Falopio puede salvarse. Independientemente de si la trompa de Falopio puede salvarse, una madre que ha tenido un Psychiatristembarazo ectpico tiene un riesgo mucho mayor de Warehouse managertener otro embarazo ectpico en los embarazos futuros. Neomia DearUna seria preocupacin es el desgarro potencial de la trompa de Falopio (ruptura). Si esto ocurre, es necesario realizar [place]una ciruga de emergencia para Office managerremover el embarazo, y el metotrexato no se puede Chemical engineerutilizar.  La paciente ideal para recibir Air cabin crewel metotrexato es aquella que:   No sufra una hemorragia interna.  No sienta dolor abdominal intenso o persistente.  Se haya comprometido a seguir adelante con las pruebas de laboratorio y Water quality scientistconcurrir a los controles hasta que el embarazo ectpico se haya absorbido.  Sea sana y tenga las funciones hepticas y renales normales en el momento de la evaluacin. El metotrexato no debe indicarse en mujeres que:   Estn amamantando.  Tener un embarazo normal (embarazo intrauterino).  Sufrir enfermedades hepticas, pulmonares o  renales.  Tienen problemas sanguneos.  Son alrgicas al Lockheed Martinmetotrexato.  Sufren de Office managerlcera pptica.  Tener un embarazo ectpico mayor de 1  pulgadas (3,5 cm) o en el que haya latidos cardacos fetales. Esta es una norma que se sigue la mayor parte del tiempo (contraindicacin [place] Victoryrelativa). ANTES DEL TRATAMIENTO  Antes de administrar el medicamento:  Le realizarn pruebas hepticas, renales y anlisis de sangre completo.  Los ARAMARK Corporationanlisis de sangre se Radiographer, therapeuticrealizan para Owens-Illinoismedir los niveles hormonales de Psychiatristembarazo y para Warehouse managerdeterminar el tipo sanguneo de la Butnermadre.  Si la mujer es Rh negativa y el padre es Rh positivo o su Rh es desconocido, le administrarn una inyeccin de RhoGAM. TRATAMIENTO Hay dos formas que puede indicar el mdico para usar Air cabin crewel metotrexato. Una forma consiste en una sola dosis o inyeccin del medicamento. La otra forma consiste en una serie de dosis. Este mtodo implica la aplicacin de varias inyecciones. Los ARAMARK Corporationanlisis de sangre se tomarn durante varias semanas para Harley-Davidsoncomprobar los niveles de la hormona del [place]embarazo. Los ARAMARK Corporationanlisis de sangre se Programme researcher, broadcasting/film/videorealizan hasta que no se detecta ms hormona del Arts development officerembarazo en la sangre.  DESPUS DEL TRATAMIENTO  Durante algunas semanas le harn anlisis de sangre para controlar los niveles de hormonas del Keatsembarazo. Los anlisis se indican hasta que no se detecte ms hormonas del Arts development officerembarazo en la sangre. Tambin existe riesgo de ruptura del embarazo ectpico cuando se Engineer, materialsutiliza el metotrexato. Este medicamento tambin puede causar Fort Lawnefectos adversos, que son:   Elliot Cousinuseas y vmitos.  Llagas en la boca.  Diarrea.  Erupcin.  Mareos.  Mayor dolor abdominal.  Aumento de la hemorragia vaginal.  Neumona.  Rayna SextonFracaso del tratamiento.  Prdida  del cabello (raro y reversible). °En muy raras ocasiones puede afectar el recuento de glóbulos rojos, el hígado, la médula o los niveles de hormonas. Si esto sucede, el profesional necesitará realizarle otras evaluaciones.   °Document Released: 11/22/2004 Document Revised: 05/07/2011 °ExitCare® Patient Information ©2014 [employer]. ° °

## 2013-06-24 NOTE — MAU Provider Note (Signed)
Chief Complaint: Follow-up   None    SUBJECTIVE HPI: [name] is a 33 y.o. [id number] at 7025w1d by LMP who presents 4 followup quantitative beta hCG. She has no abdominal  pain and no bleeding. [facility] showed possible [facility]. [facility]. She returned on 4/19 for beta hcg which did not rise appropriately. The patient was offered MTX and she declined. She opted to come back one additional time to see if the beta hcg would rise. Pregnancy is not desired. Beta hcg levels:  4/17: 1026  4/19: 1582  4/21: 2122> received MTX Day 4 on 4/ 24: 3193 Day 8 today 2737   Past Medical History  Diagnosis Date  . No pertinent past medical history    OB History  Gravida Para Term Preterm AB SAB TAB Ectopic Multiple Living  8 6 6  0 1 1 0 0 0 6    # Outcome Date GA Lbr Len/2nd Weight Sex Delivery Anes PTL Lv  8 CUR           7 TRM 08/08/11 [id number] 03:36 3.325 kg (7 lb 5.3 oz) M SVD None  Y  6 TRM           5 TRM           4 TRM           3 TRM           2 TRM           1 SAB              Past Surgical History  Procedure Laterality Date  . No past surgeries     History   Social History  . Marital Status: Single    Spouse Name: N/A    Number of Children: N/A  . Years of Education: N/A   Occupational History  . Not on file.   Social History Main Topics  . Smoking status: Never Smoker   . Smokeless tobacco: Never Used  . Alcohol Use: No  . Drug Use: No  . Sexual Activity: Yes    Birth Control/ Protection: None   Other Topics Concern  . Not on file   Social History Narrative  . No narrative on file   No current facility-administered medications on file prior to encounter.   Current Outpatient Prescriptions on File Prior to Encounter  Medication Sig Dispense Refill  . Prenatal Vit-Fe Fumarate-FA (PRENATAL MULTIVITAMIN) TABS Take 1  tablet by mouth daily.       No Known Allergies  ROS: Pertinent items in HPI  OBJECTIVE Blood pressure 125/59, pulse 82, temperature 97.6 F (36.4 C), temperature source Oral, resp. rate 18, height 5\' 1"  (1.549 m), weight 86.75 kg (191 lb 4 oz), last menstrual period 04/21/2013, SpO2 100.00%, unknown if currently breastfeeding. GENERAL: Well-developed, well-nourished female in no acute distress.  HEENT: Normocephalic HEART: normal rate RESP: normal effort ABDOMEN: Soft, non-tender EXTREMITIES: Nontender, no edema NEURO: Alert and oriented LAB RESULTS Results for orders placed during the hospital encounter of 06/24/13 (from the past 24 hour(s))  HCG, QUANTITATIVE, PREGNANCY     Status: Abnormal   Collection Time    06/24/13  6:52 PM      Result Value Ref Range   hCG, Beta Chain, Quant, S 2737 (*) <5 mIU/mL    IMAGING [place] Ob Comp Less 14  Wks  06/16/2013   CLINICAL DATA:  Followup  EXAM: OBSTETRIC <14 WK [place] AND TRANSVAGINAL OB US  TECHNIQUE: Both transabdominal and transvaginal ultrasound examinations were performed for complete evaluation of the gestation as well as the maternal uterus, adnexal regions, and pelvic cul-de-sac. Transvaginal technique was performed to assess early pregnancy.  COMPARISON:  US OB COMP LESS 14 WK dated 06/12/2013; US OB COMP LESS 14 WK dated 07/19/2010  FINDINGS: Intrauterine gestational sac: No gestational sac is identified. An oblong irregular fluid collection in the endometrial canal is present. Decidual cast surrounding the fluid collection is noted.  Yolk sac:  Not visualized.  Embryo:  Not visualized.  Cardiac Activity: Not applicable.  Heart Rate:  Not applicable. Bpm  Maternal uterus/adnexae: A left ovary is within normal limits. Adjacent to the left ovary, there is a large 6.3 x 5.7 x 6.4 cm simple paraovarian cyst. 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 No ectopic  gestational sac, tubal ring, or embryo can be identified  IMPRESSION: Abnormal findings are again noted. There is no gestational sac. Only an irregular fluid collection with decidual cast is noted. Differential diagnosis includes ectopic pregnancy and spontaneous abortion.   Electronically Signed   By: Maryclare Bean M.D.   On: 06/16/2013 20:43   US Ob Comp Less 14 Wks  06/15/2013   CLINICAL DATA:  Pelvic pain. Positive urine pregnancy test.  EXAM: TRANSVAGINAL OB ULTRASOUND  TECHNIQUE: Transvaginal ultrasound was performed for complete evaluation of the gestation as well as the maternal uterus, adnexal regions, and pelvic cul-de-sac.  COMPARISON:  US OB COMP +14 WK dated 07/09/2011  FINDINGS: Intrauterine gestational sac: Mildly complex fluid within a 1.9 x 3.8 x 1.6 cm endometrial fluid collection with somewhat irregular margins. Thickened adjacent endometrium approximately 1.8 cm.  Yolk sac: Not visualize  Embryo: Not visualized  Cardiac Activity: Not applicable  Maternal uterus/adnexae: Nonvisualization of the right ovary. Limited visualization of the left ovary. No adnexal mass is seen. No free pelvic fluid observed.  IMPRESSION: Irregular and elongated complex fluid collection in the endometrial cavity, mildly thickened surrounding endometrium, and nonvisualization of yolk sac or embryo. The ovaries were particularly difficult to visualize, with nonvisualization of the right ovary an only partial visualization of the left ovary. Differential diagnostic considerations include spontaneous abortion and occult ectopic pregnancy with pseudogestational sac. Appearance is not likely to be compatible with successful pregnancy. Careful correlation with quantitative beta HCG trend and a low threshold for ultrasound reimaging is recommended.   Electronically Signed   By: Herbie Baltimore M.D.   On: 06/15/2013 13:16   US Ob Transvaginal  06/16/2013   CLINICAL DATA:  Followup  EXAM: OBSTETRIC <14 WK [place] AND TRANSVAGINAL OB  US  TECHNIQUE: Both transabdominal and transvaginal ultrasound examinations were performed for complete evaluation of the gestation as well as the maternal uterus, adnexal regions, and pelvic cul-de-sac. Transvaginal technique was performed to assess early pregnancy.  COMPARISON:  US OB COMP LESS 14 WK dated 06/12/2013; US OB COMP LESS 14 WK dated 07/19/2010  FINDINGS: Intrauterine gestational sac: No gestational sac is identified. An oblong irregular fluid collection in the endometrial canal is present. Decidual cast surrounding the fluid collection is noted.  Yolk sac:  Not visualized.  Embryo:  Not visualized.  Cardiac Activity: Not applicable.  Heart Rate:  Not applicable. Bpm  Maternal uterus/adnexae: A left ovary is within normal limits. Adjacent to the  left ovary, there is a large 6.3 x 5.7 x 6.4 cm simple paraovarian cyst. 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 No ectopic gestational sac, tubal ring, or embryo can be identified  IMPRESSION: Abnormal findings are again noted. There is no gestational sac. Only an irregular fluid collection with decidual cast is noted. Differential diagnosis includes ectopic pregnancy and spontaneous abortion.   Electronically Signed   By: Maryclare Bean M.D.   On: 06/16/2013 20:43   US Ob Transvaginal  06/12/2013   CLINICAL DATA:  Pelvic pain.  Positive urine pregnancy test.  EXAM: TRANSVAGINAL OB ULTRASOUND  TECHNIQUE: Transvaginal ultrasound was performed for complete evaluation of the gestation as well as the maternal uterus, adnexal regions, and pelvic cul-de-sac.  COMPARISON:  US OB COMP +14 WK dated 07/09/2011  FINDINGS: Intrauterine gestational sac: Mildly complex fluid within a 1.9 x 3.8 x 1.6 cm endometrial fluid collection with somewhat irregular margins. Thickened adjacent endometrium approximately 1.8 cm.  Yolk sac:  Not visualize  Embryo:  Not visualized  Cardiac Activity: Not  applicable  Maternal uterus/adnexae: Nonvisualization of the right ovary. Limited visualization of the left ovary. No adnexal mass is seen. No free pelvic fluid observed.  IMPRESSION: 1. Irregular and elongated complex fluid collection in the endometrial cavity, mildly thickened surrounding endometrium, and nonvisualization of yolk sac or embryo. The ovaries were particularly difficult to visualize, with nonvisualization of the right ovary an only partial visualization of the left ovary. Differential diagnostic considerations include spontaneous abortion and occult ectopic pregnancy with pseudogestational sac. Appearance is not likely to be compatible with successful pregnancy. Careful correlation with quantitative beta HCG trend and a low threshold for ultrasound reimaging is recommended.   Electronically Signed   By: Herbie Baltimore M.D.   On: 06/12/2013 18:03    MAU COURSE  Consulted  Dr. Emelda Fear: and she is a candidate for methotrexate repeat dose. She will not need to return on Day 4 but should return on Day 7 for quantitative beta hCG Repeat labs and MTX today  ASSESSMENT  Day 8 post MTX without adequate drop in quantitative beta hCG  PLAN Discharge home with ectopic precautions    Medication List         prenatal multivitamin Tabs tablet  Take 1 tablet by mouth daily.         Danae Orleans, CNM 06/24/2013  8:11 PM  Care assumed by [place] CNM at 2055. Labs pending.   Results for orders placed during the hospital encounter of 06/24/13 (from the past 24 hour(s))  HCG, QUANTITATIVE, PREGNANCY     Status: Abnormal   Collection Time    06/24/13  6:52 PM      Result Value Ref Range   hCG, Beta Chain, Quant, S 2737 (*) <5 mIU/mL  CBC WITH DIFFERENTIAL     Status: Abnormal   Collection Time    06/24/13  6:52 PM      Result Value Ref Range   WBC 8.3  4.0 - 10.5 K/uL   RBC 4.49  3.87 - 5.11 MIL/uL   Hemoglobin 11.5 (*) 12.0 - 15.0 g/dL   HCT 40.9 (*) 81.1 - 91.4 %    MCV 78.6  78.0 - 100.0 fL   MCH 25.6 (*) 26.0 - 34.0 pg   MCHC 32.6  30.0 - 36.0 g/dL   RDW 78.2 (*) 95.6 - 21.3 %   Platelets 275  150 - 400 K/uL   Neutrophils Relative % 56  43 - 77 %   Neutro Abs 4.7  1.7 - 7.7 K/uL   Lymphocytes Relative 33  12 - 46 %   Lymphs Abs 2.8  0.7 - 4.0 K/uL   Monocytes Relative 9  3 - 12 %   Monocytes Absolute 0.7  0.1 - 1.0 K/uL   Eosinophils Relative 2  0 - 5 %   Eosinophils Absolute 0.2  0.0 - 0.7 K/uL   Basophils Relative 0  0 - 1 %   Basophils Absolute 0.0  0.0 - 0.1 K/uL  BASIC METABOLIC PANEL     Status: Abnormal   Collection Time    06/24/13  6:52 PM      Result Value Ref Range   Sodium 140  137 - 147 mEq/L   Potassium 3.7  3.7 - 5.3 mEq/L   Chloride 103  96 - 112 mEq/L   CO2 23  19 - 32 mEq/L   Glucose, Bld 76  70 - 99 mg/dL   BUN 11  6 - 23 mg/dL   Creatinine, Ser 8.110.46 (*) 0.50 - 1.10 mg/dL   Calcium 9.0  8.4 - 91.410.5 mg/dL   GFR calc non Af Amer >90  >90 mL/min   GFR calc Af Amer >90  >90 mL/min  HEPATIC FUNCTION PANEL     Status: Abnormal   Collection Time    06/24/13  6:52 PM      Result Value Ref Range   Total Protein 6.8  6.0 - 8.3 g/dL   Albumin 3.3 (*) 3.5 - 5.2 g/dL   AST 12  0 - 37 U/L   ALT 13  0 - 35 U/L   Alkaline Phosphatase 106  39 - 117 U/L   Total Bilirubin <0.2 (*) 0.3 - 1.2 mg/dL   Bilirubin, Direct <7.8<0.2  0.0 - 0.3 mg/dL   Indirect Bilirubin NOT CALCULATED  0.3 - 0.9 mg/dL   [place] Jullien Granquist, CNM 2/95/62134/29/2015 10:06 PM

## 2013-06-28 NOTE — MAU Provider Note (Signed)
Attestation of Attending Supervision of Advanced Practitioner: Evaluation and management procedures were performed by the PA/NP/CNM/OB Fellow under my supervision/collaboration. Chart reviewed and agree with management and plan.  Tilda BurrowJohn V Emory Gallentine 06/28/2013 12:35 AM

## 2013-07-02 ENCOUNTER — Inpatient Hospital Stay (HOSPITAL_COMMUNITY)
Admission: AD | Admit: 2013-07-02 | Discharge: 2013-07-02 | Disposition: A | Discharge status: Home / Self Care | Payer: Self-pay | Source: Ambulatory Visit | Attending: Obstetrics and Gynecology | Admitting: Obstetrics and Gynecology

## 2013-07-02 DIAGNOSIS — O009 Unspecified ectopic pregnancy without intrauterine pregnancy: Secondary | ICD-10-CM

## 2013-07-02 DIAGNOSIS — O00109 Unspecified tubal pregnancy without intrauterine pregnancy: Secondary | ICD-10-CM | POA: Insufficient documentation

## 2013-07-02 LAB — HCG, QUANTITATIVE, PREGNANCY: hCG, Beta Chain, Quant, S: 21 m[IU]/mL — ABNORMAL HIGH (ref <5)

## 2013-07-02 NOTE — Discharge Instructions (Signed)
Embarazo ectópico  ( Ectopic Pregnancy)  Un embarazo ectópico se produce cuando el óvulo fecundado se adhiere (implanta) fuera del útero. La mayoría de los embarazos ectópicos se producen en la trompa de Falopio. Es raro que ocurran en el ovario, el intestino, la pelvis o el cuello del útero. En un embarazo ectópico, el óvulo fertilizado no tiene la capacidad de llegar a ser un bebé normal y sano.   Una ruptura en el embarazo ectópico se produce cuando la trompa de Falopio se desgarra o se rompe y da como resultado una hemorragia interna. A menudo hay un intenso dolor abdominal y en algunos casos sangrado vaginal. Tener un embarazo ectópico puede ser una experiencia que pone en peligro la vida. Si no se trata, esta afección es muy grave y puede requerir una transfusión de sangre, cirugía abdominal, o incluso causar la muerte.  CAUSAS   En la mayoría de los casos se sospecha un daño en las trompas de Falopio como causa del embarazo ectópico.   FACTORES DE RIESGO  Dependiendo de sus circunstancias, el nivel de riesgo de tener un embarazo ectópico variará. El nivel de riesgo puede dividirse en tres categorías.  Alto Riesgo  · Usted ha realizado tratamientos para la infertilidad.  · Ha tenido un embarazo ectópico previo.  · Fue sometida a una cirugía de trompas.  · Tuvo una cirugía previa para ligar las trompas de Falopio (ligadura de trompas).  · Tiene problemas o enfermedades en las trompas.  · Ha estado expuesta al DES. El DES es un medicamento que se utilizó hasta 1971 y tuvo efectos en los bebés cuyas madres lo tomaron.  · Queda embarazada mientras usa un dispositivo intrauterino (DIU) como método anticonceptivo.  Riesgo moderado  · Tiene antecedentes de infertilidad.  · Ha sufrido alguna enfermedad de transmisión sexual (ETS).  · Tiene antecedentes de enfermedad pélvica inflamatoria (EPI).  · Tiene cicatrices por endometriosis.  · Tiene múltiples parejas sexuales.  · Fuma.  Bajo riesgo  · Fue sometida a una  cirugía pélvica.  · Usa duchas vaginales.  · Comenzó a ser sexualmente activa antes de los 18 años de edad.  SIGNOS Y SÍNTOMAS   El embarazo ectópico se debe sospechar en cualquier mujer a la que le ha faltado un período y tiene dolor abdominal o sangrado.  · Puede experimentar síntomas normales de embarazo, tales como:  · Náuseas.  · Cansancio.  · Inflamación mamaria.  · Otros síntomas son:  · Dolor durante las relaciones sexuales.  · Hemorragia vaginal o manchado irregular.  · Cólicos o dolor en uno de los lados o en la zona inferior del abdomen.  · Latidos cardíacos acelerados.  · Desmayarse al defecar.  · Los síntomas de un embarazo ectópico con ruptura y hemorragias internas son:  · Dolor intenso y súbito en el abdomen y la pelvis.  · Mareos o desmayos.  · Dolor en la zona del hombro.  DIAGNÓSTICO   Los exámenes que se indicarán son:  · Test de embarazo.  · Una ecografía.  · Prueba de nivel específico de hormona del embarazo en el torrente sanguíneo.  · Extracción de una muestra de tejido del útero (dilatación y curetaje, D y C).  · Cirugía para realizar un examen visual del interior del abdomen usando un tubo delgado, que emite luz con una pequeña cámara en el extremo (laparoscopio).  TRATAMIENTO   Se podrá aplicar una inyección de un medicamento denominado metotrexato. Este medicamento hace que se absorba el tejido   del embarazo. Se administra en los siguientes casos:  · Hay un diagnóstico temprano.  · La trompa de Falopio no se ha roto.  · Se la considera una buena candidata para recibir el medicamento.  Por lo general, después del tratamiento con metotrexato se comprueban los niveles de hormonas del embarazo. Esto se hace para asegurarse de que el medicamento es efectivo. Puede llevar entre cuatro y seis semanas para que el embarazo sea absorbido (aunque la mayoría de los embarazos se absorberá a tres semanas).  Puede ser necesario realizar un tratamiento quirúrgico. Se puede utilizar un laparoscopio para  retirar el tejido del embarazo. Si hay una hemorragia interna grave, le harán un corte (incisión) en la zona inferior del abdomen (laparotomía), y se extirpará el embarazo ectópico. De este modo de detendrá el sangrado. Es posible que también se extirpe una parte de la trompa de Falopio, o toda la trompa (salpingectomía). Luego de la cirugía, le harán un análisis de la hormona del embarazo para asegurarse de que no quedan tejidos. Quizás reciba la vacuna de inmunoglobulina Rho(D) si usted es Rh negativa y el padre es Rh positivo, o si no conoce el tipo de Rh del padre. Esto evita problemas en próximos embarazos.  SOLICITE ATENCIÓN MÉDICA DE INMEDIATO SI:   Tiene síntomas de embarazo ectópico. Esto es una emergencia médica.  Document Released: 02/12/2005 Document Revised: 12/03/2012  ExitCare® Patient Information ©2014 [employer].

## 2013-07-02 NOTE — MAU Provider Note (Signed)
  History     CSN: [id number]  Arrival date and time: 07/02/13 1837   None     Chief Complaint  Patient presents with  . Follow-up   HPI  [name] is a 33 y.o. [id number] who is here for day #7 after 2nd dose of MTX. She had a second dose on 06/24/13 because her HCG was not falling as it should have. She was told to come on day #7, and did not need to come on day #4. She denies any bleeding or pain at this time.   Past Medical History  Diagnosis Date  . No pertinent past medical history     Past Surgical History  Procedure Laterality Date  . No past surgeries      Family History  Problem Relation Age of Onset  . Anesthesia problems Neg Hx   . Hearing loss Neg Hx     History  Substance Use Topics  . Smoking status: Never Smoker   . Smokeless tobacco: Never Used  . Alcohol Use: No    Allergies: No Known Allergies  Prescriptions prior to admission  Medication Sig Dispense Refill  . Prenatal Vit-Fe Fumarate-FA (PRENATAL MULTIVITAMIN) TABS Take 1 tablet by mouth daily.        ROS Physical Exam   Blood pressure 130/75, pulse 80, temperature 99 F (37.2 C), temperature source Oral, resp. rate 16, height 5\' 1"  (1.549 m), weight 84.936 kg (187 lb 4 oz), last menstrual period 04/21/2013, SpO2 100.00%, unknown if currently breastfeeding.  Physical Exam  Nursing note and vitals reviewed. Constitutional: She is oriented to person, place, and time. She appears well-developed and well-nourished. No distress.  Cardiovascular: Normal rate.   Respiratory: Effort normal.  GI: Soft. There is no tenderness.  Neurological: She is alert and oriented to person, place, and time.  Skin: Skin is warm and dry.  Psychiatric: She has a normal mood and affect.    MAU Course  Procedures  Results for [name], [name] (MRN [id number]) as of 07/02/2013 19:39  Ref. Range 06/24/2013 18:52 07/02/2013 19:00  hCG, Beta Chain, Quant, S Latest Range: <5 mIU/mL 2737 (H) 21 (H)     Assessment and Plan   1. Ectopic pregnancy    Has had MTX X 2  HCG has gone down dramatically Repeat bloodwork in 1 week the clinic  Follow-up Information   [facility]. (Please come to the clinic on May 14th between 8 and 11 am for repeat bloodwork )    Specialty:  Obstetrics and Gynecology   Contact information:   [address] [place] [address] [phone]       Tawnya CrookHeather Donovan Cal Gindlesperger 07/02/2013, 7:39 PM

## 2013-07-03 NOTE — MAU Provider Note (Signed)
Attestation of Attending Supervision of Advanced Practitioner (CNM/NP): Evaluation and management procedures were performed by the Advanced Practitioner under my supervision and collaboration.  I have reviewed the Advanced Practitioner's note and chart, and I agree with the management and plan.  Corinthia Helmers 07/03/2013 6:12 AM

## 2013-07-09 ENCOUNTER — Other Ambulatory Visit: Payer: Self-pay

## 2013-07-10 ENCOUNTER — Encounter: Payer: Self-pay | Admitting: *Deleted

## 2013-07-24 ENCOUNTER — Encounter: Payer: Self-pay | Admitting: General Practice

## 2013-07-27 NOTE — Clinical Documentation Improvement (Signed)
Chart accessed for purpose of chart audit in reference to [place] [employer] on Infant Mortality [employer].  

## 2013-07-31 ENCOUNTER — Other Ambulatory Visit: Payer: Self-pay

## 2013-07-31 DIAGNOSIS — O039 Complete or unspecified spontaneous abortion without complication: Secondary | ICD-10-CM

## 2013-07-31 LAB — HCG, QUANTITATIVE, PREGNANCY

## 2013-10-16 ENCOUNTER — Encounter: Payer: Self-pay | Admitting: General Practice

## 2013-12-28 ENCOUNTER — Encounter (HOSPITAL_COMMUNITY): Payer: Self-pay | Admitting: *Deleted

## 2014-04-17 ENCOUNTER — Encounter (HOSPITAL_COMMUNITY): Payer: Self-pay | Admitting: *Deleted

## 2014-12-29 IMAGING — US US OB COMP LESS 14 WK
1 series · 13 of 28 positions shown · non-contrast
Comparison: US OB COMP +14 WK dated 07/09/2011

CLINICAL DATA: Pelvic pain. Positive urine pregnancy test.

EXAM:
TRANSVAGINAL OB ULTRASOUND
TECHNIQUE: Transvaginal ultrasound was performed for complete evaluation of the
gestation as well as the maternal uterus, adnexal regions, and
pelvic cul-de-sac.

[Series 1: us ob comp less 14 wk · 0.22mm/px · 13 of 34 slices shown]
[im 2/34]
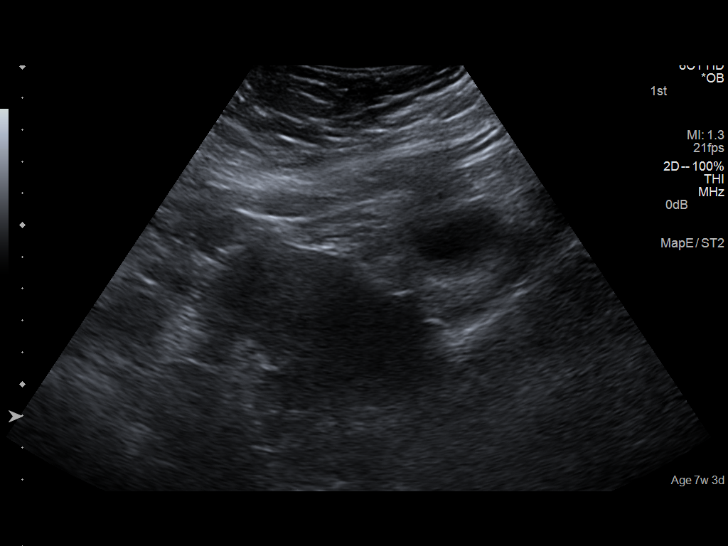
[im 4/34]
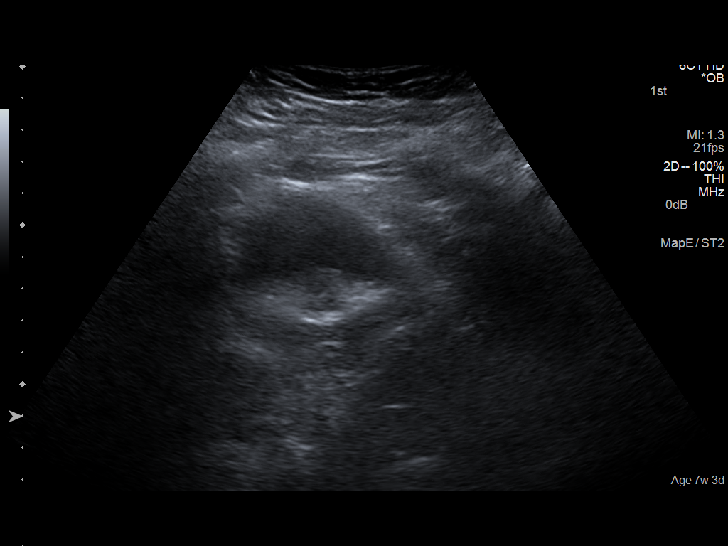
[im 7/34]
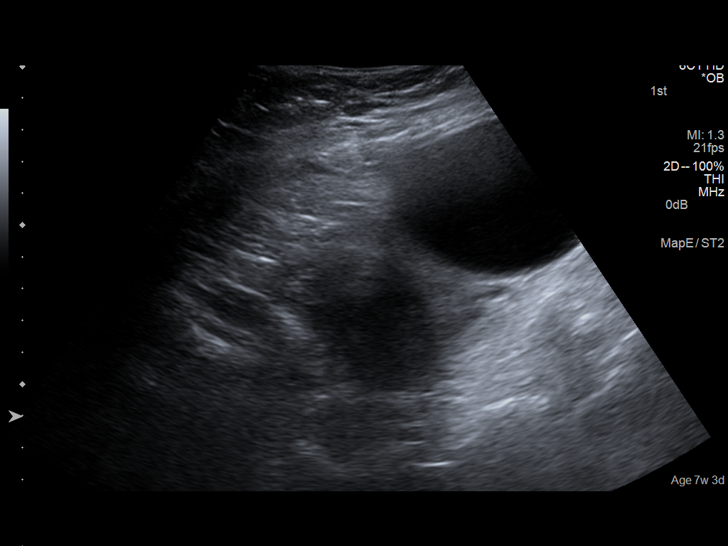
[im 9/34]
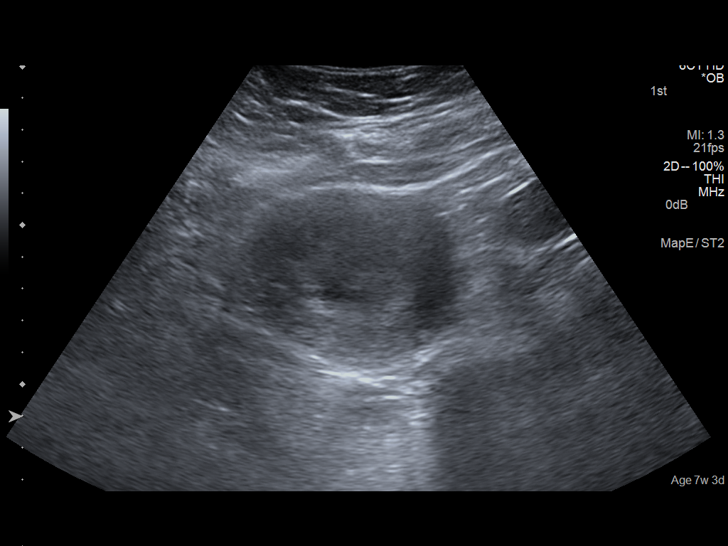
[im 12/34]
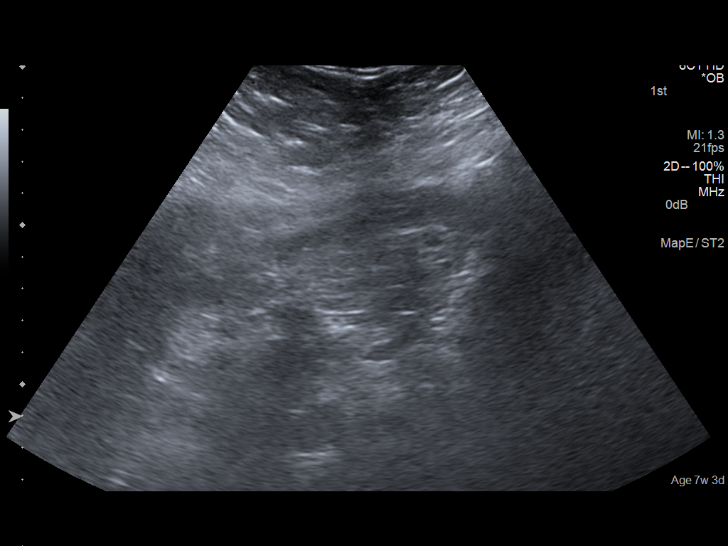
[im 14/34]
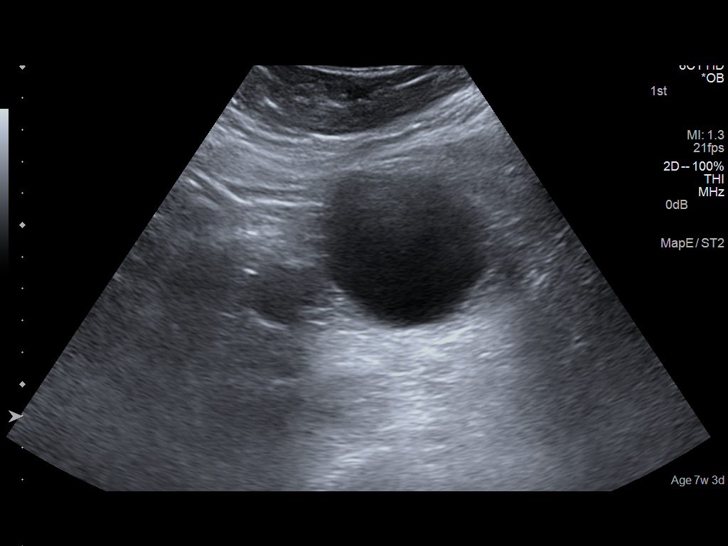
[im 18/34]
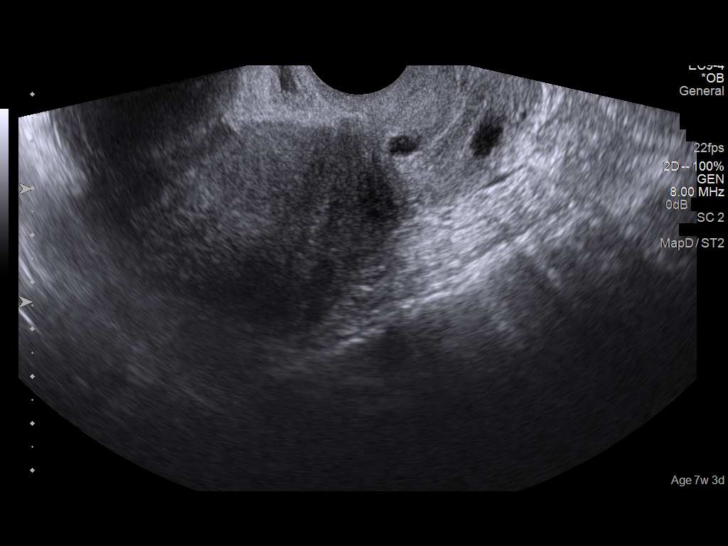
[im 20/34]
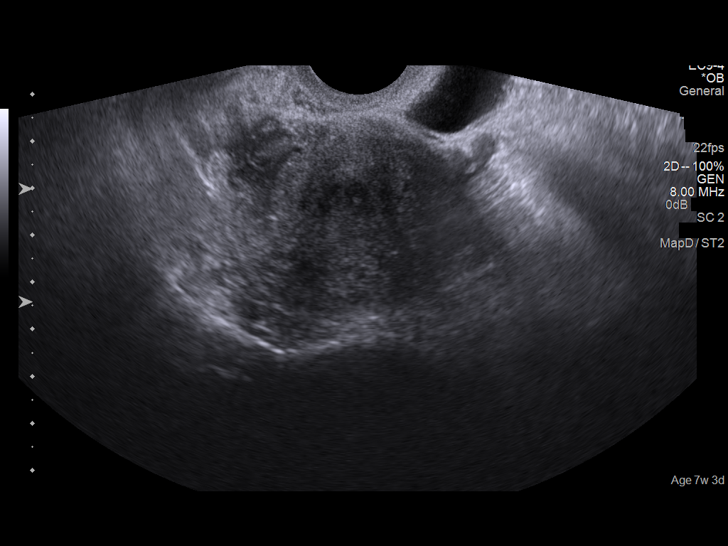
[im 23/34]
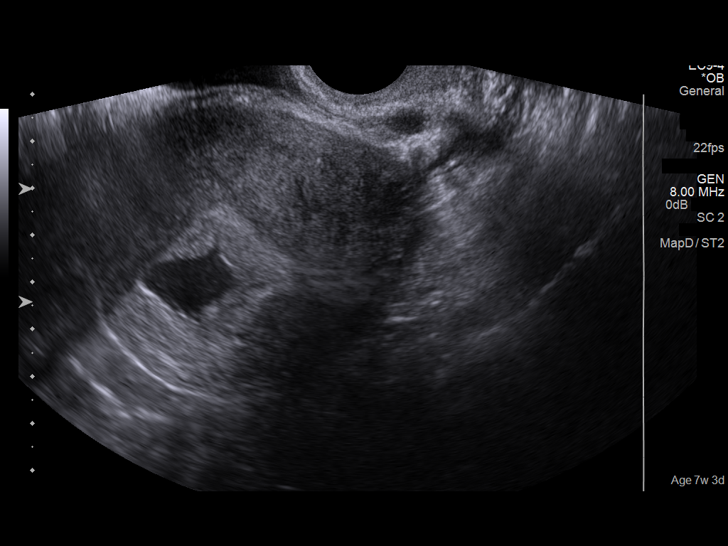
[im 25/34]
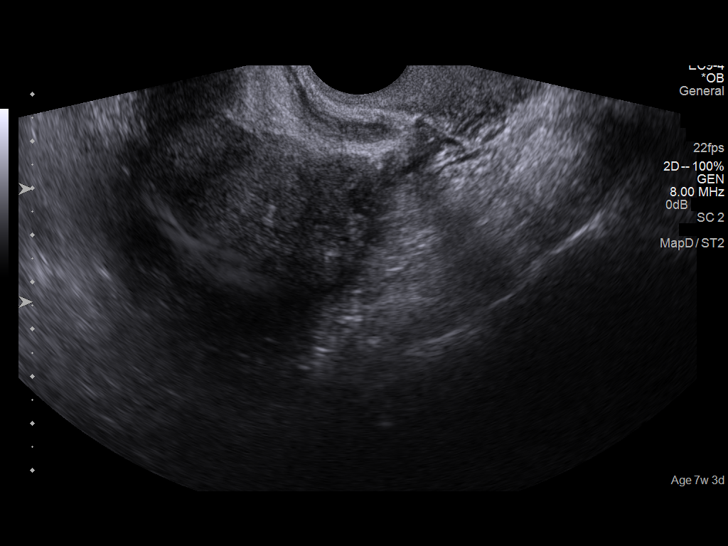
[im 27/34]
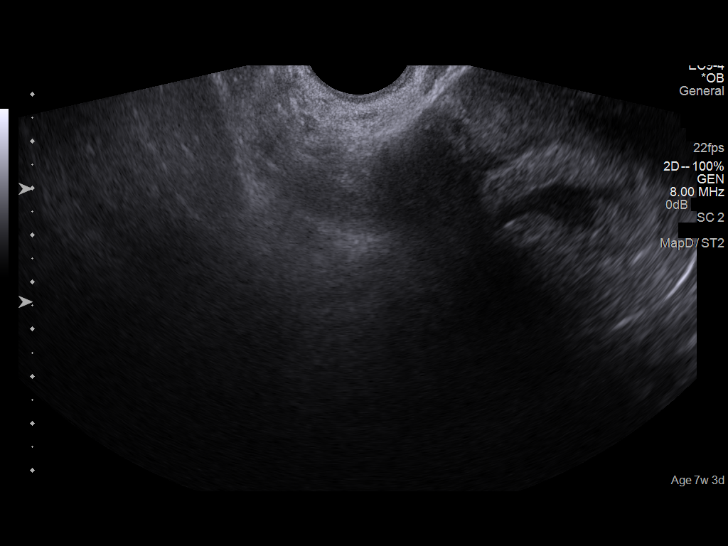
[im 30/34]
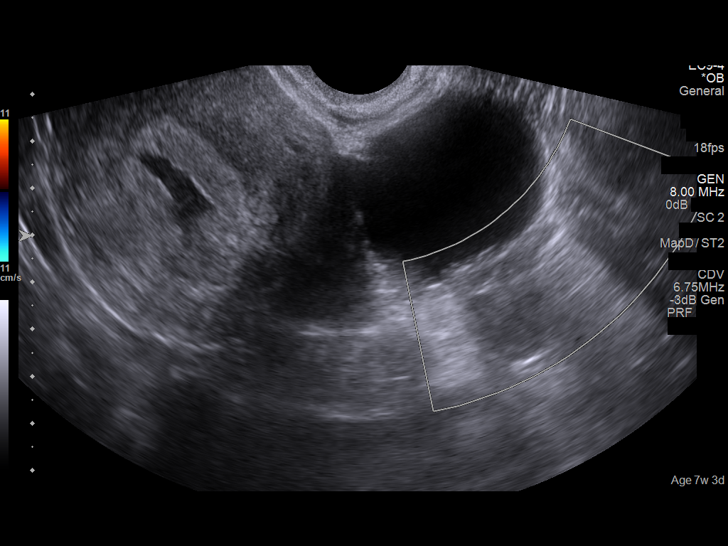
[im 32/34]
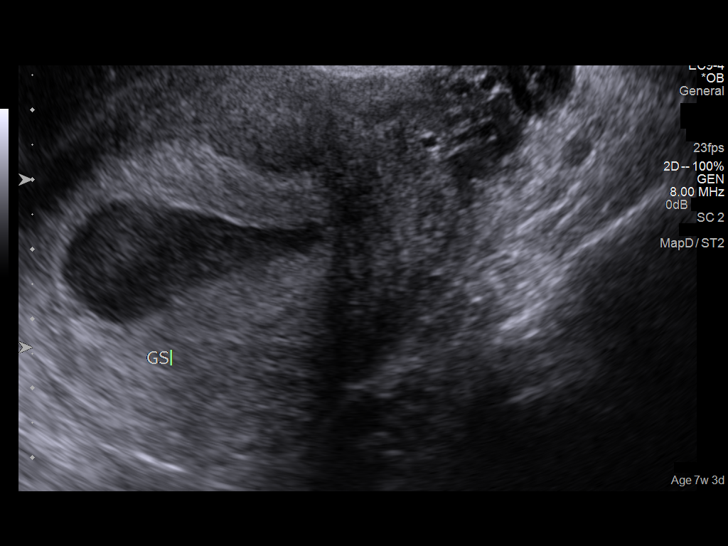

[13 of 28 positions shown; findings below may reference images not displayed]

FINDINGS: Intrauterine gestational sac: Mildly complex fluid within a 1.9 x
3.8 x 1.6 cm endometrial fluid collection with somewhat irregular
margins. Thickened adjacent endometrium approximately 1.8 cm.

Yolk sac: Not visualize

Embryo: Not visualized

Cardiac Activity: Not applicable

Maternal uterus/adnexae: Nonvisualization of the right ovary.
Limited visualization of the left ovary. No adnexal mass is seen. No
free pelvic fluid observed.
IMPRESSION: Irregular and elongated complex fluid collection in the endometrial
cavity, mildly thickened surrounding endometrium, and
nonvisualization of yolk sac or embryo. The ovaries were
particularly difficult to visualize, with nonvisualization of the
right ovary an only partial visualization of the left ovary.
Differential diagnostic considerations include spontaneous abortion
and occult ectopic pregnancy with pseudogestational sac. Appearance
is not likely to be compatible with successful pregnancy. Careful
correlation with quantitative beta HCG trend and a low threshold for
ultrasound reimaging is recommended.

## 2015-01-02 IMAGING — US US OB TRANSVAGINAL
1 series · 13 of 28 positions shown · non-contrast
Comparison: US OB COMP LESS 14 WK dated 06/12/2013; US OB COMP LESS
14 WK dated 07/19/2010

CLINICAL DATA: Followup

EXAM:
OBSTETRIC <14 WK US AND TRANSVAGINAL OB US
TECHNIQUE: Both transabdominal and transvaginal ultrasound examinations were
performed for complete evaluation of the gestation as well as the
maternal uterus, adnexal regions, and pelvic cul-de-sac.
Transvaginal technique was performed to assess early pregnancy.

[Series 1: us ob transvaginal · 13 of 56 slices shown]
[im 3/56]
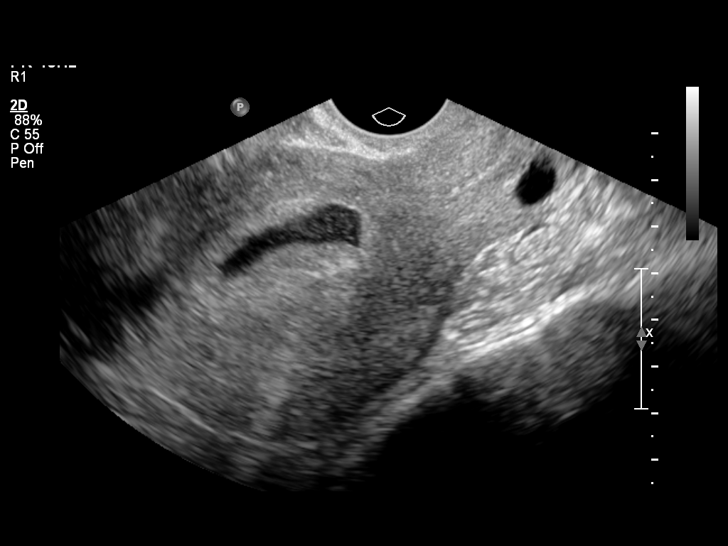
[im 7/56]
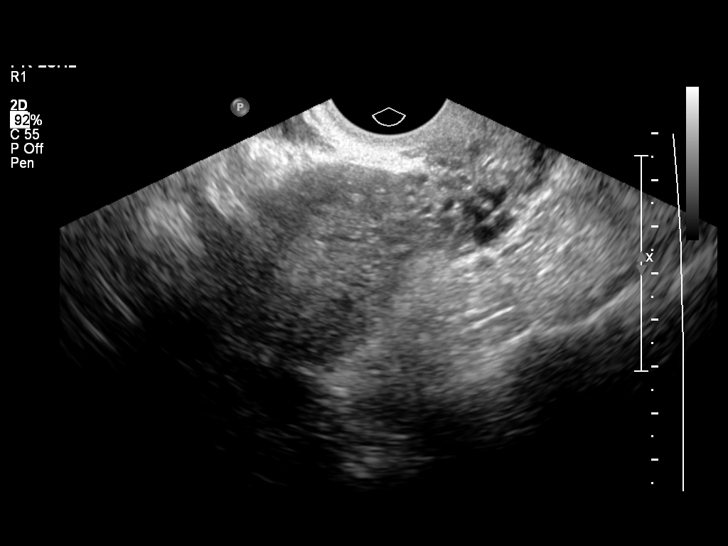
[im 11/56]
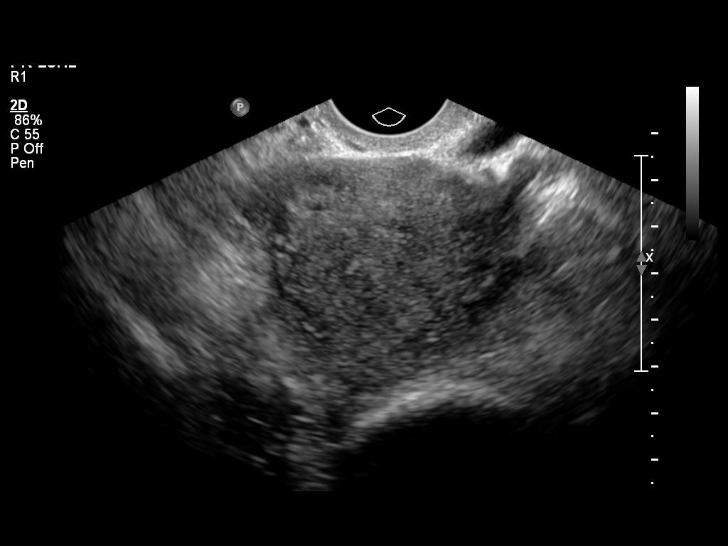
[im 15/56]
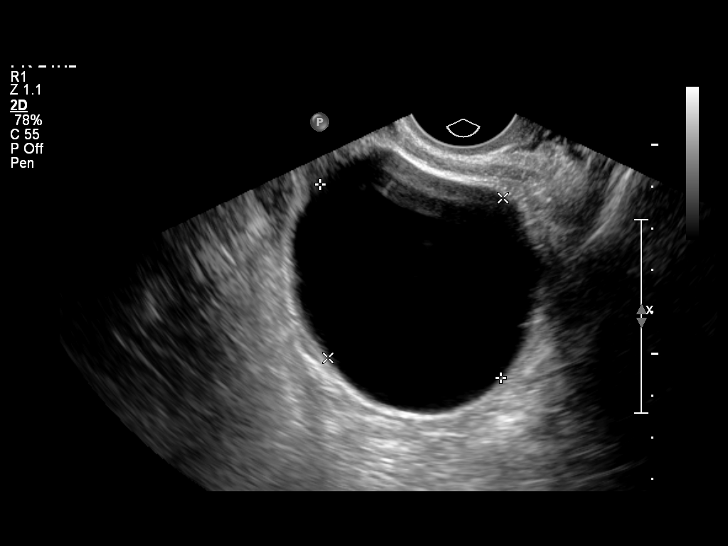
[im 19/56]
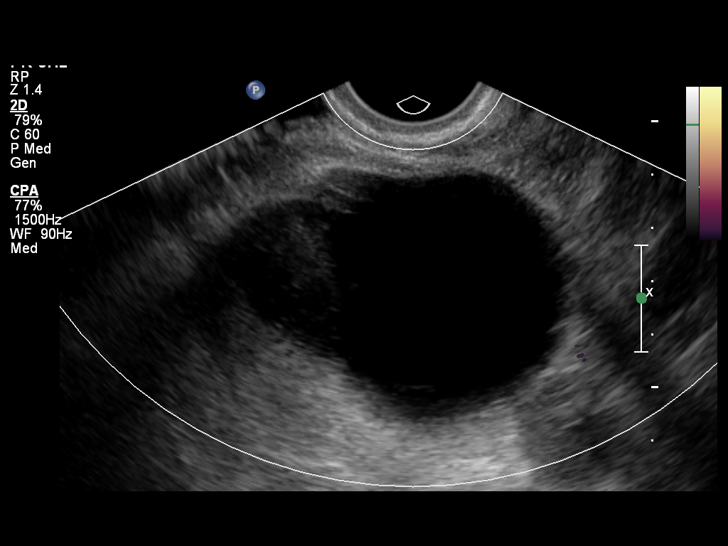
[im 23/56]
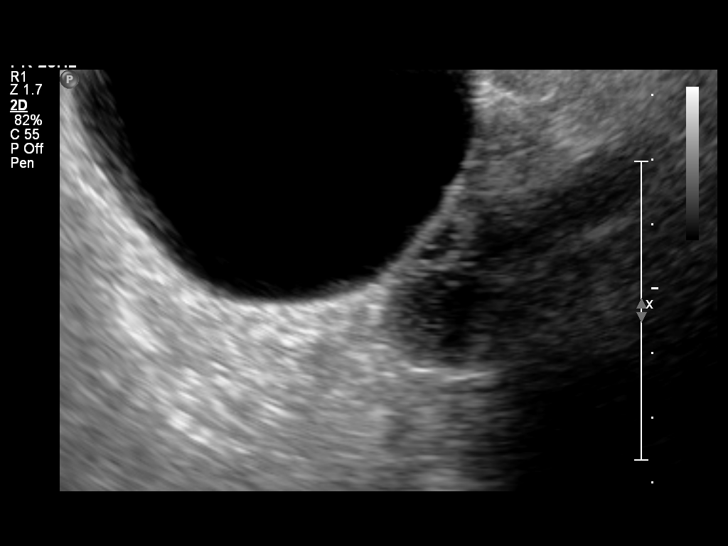
[im 29/56]
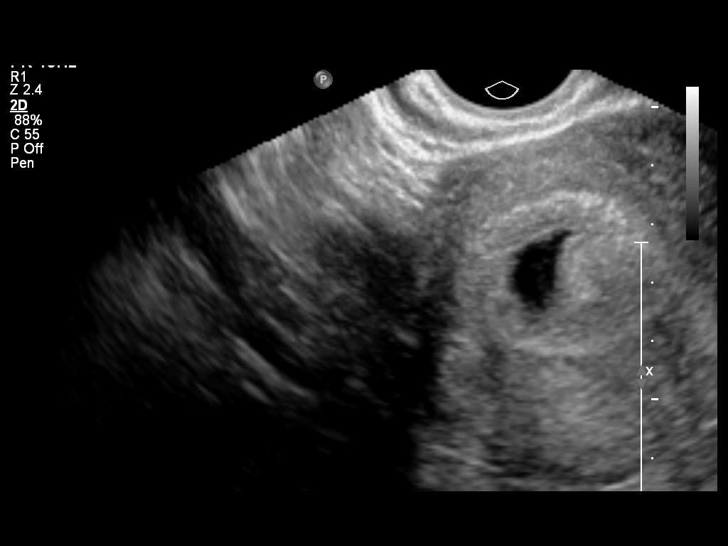
[im 33/56]
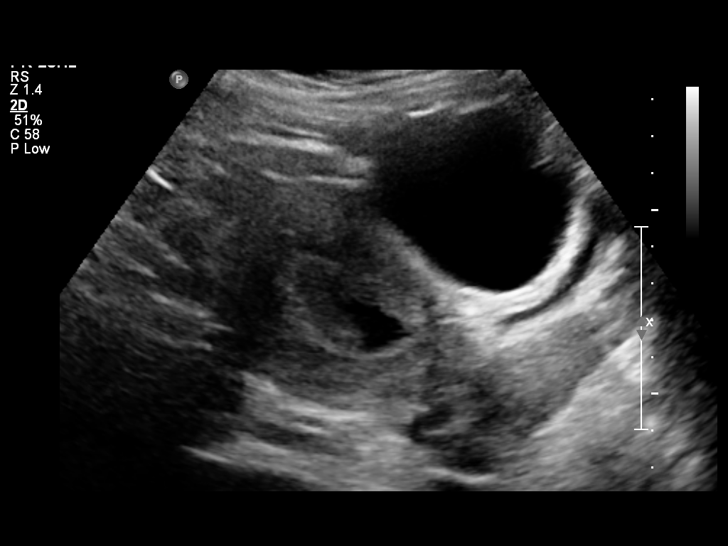
[im 37/56]
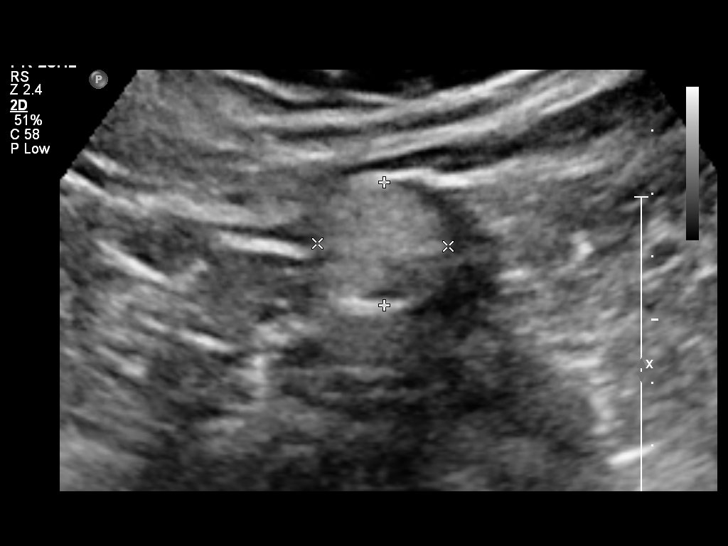
[im 41/56]
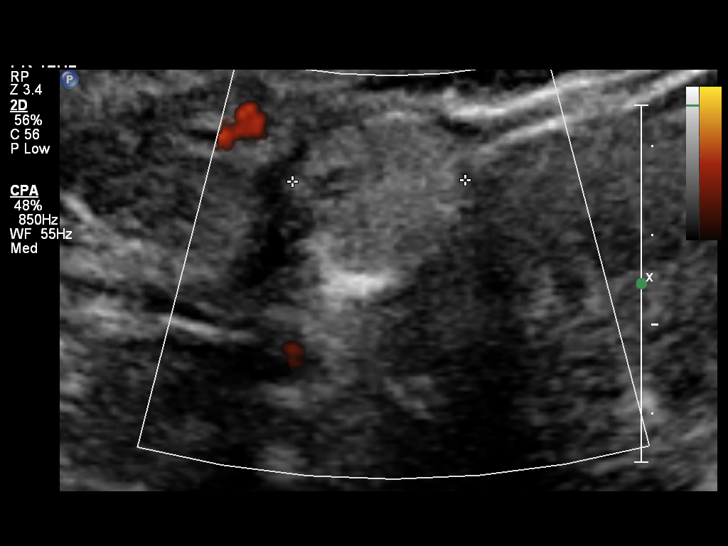
[im 45/56]
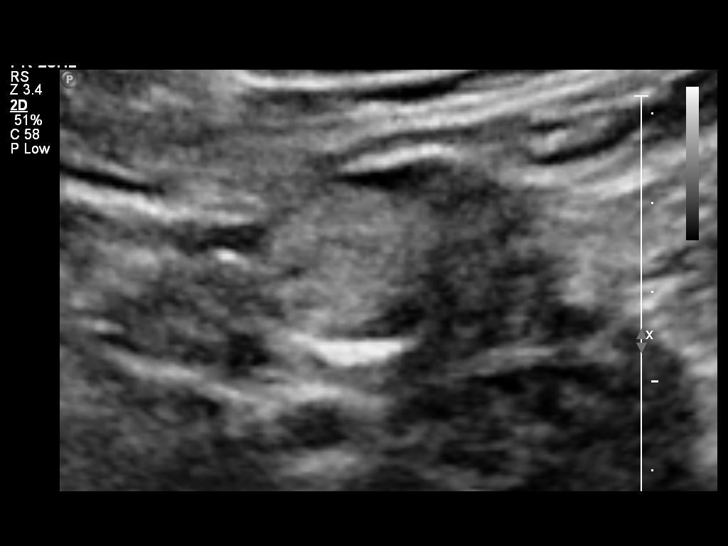
[im 49/56]
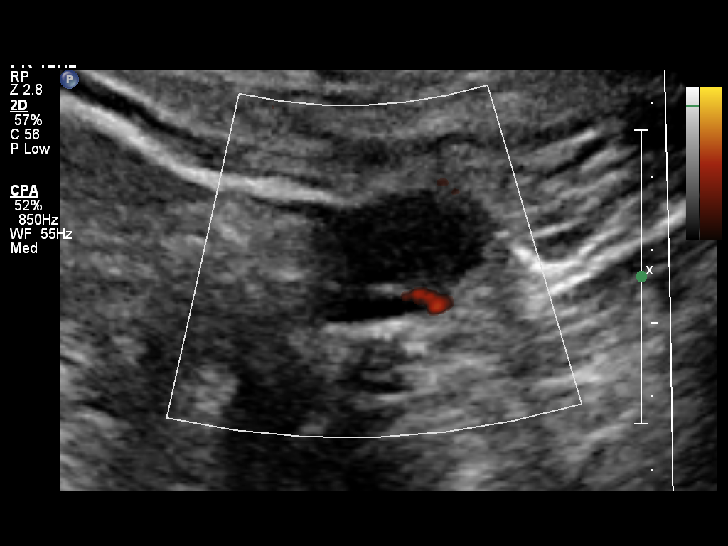
[im 53/56]
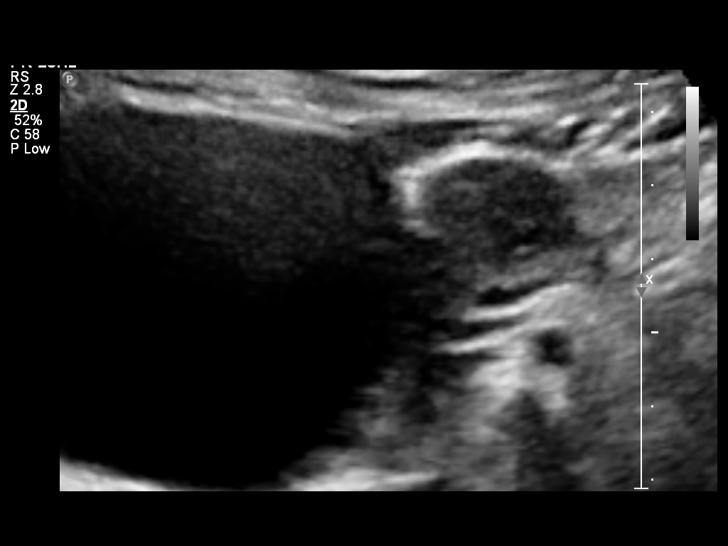

[13 of 28 positions shown; findings below may reference images not displayed]

FINDINGS: Intrauterine gestational sac: No gestational sac is identified. An
oblong irregular fluid collection in the endometrial canal is
present. Decidual cast surrounding the fluid collection is noted.

Yolk sac:  Not visualized.

Embryo:  Not visualized.

Cardiac Activity: Not applicable.

Heart Rate:  Not applicable. Bpm

Maternal uterus/adnexae: A left ovary is within normal limits.
Adjacent to the left ovary, there is a large 6.3 x 5.7 x 6.4 cm
simple paraovarian cyst. Within the right ovary, there is a 2.1 cm
hyperechoic lesion which is stable compared with 07/19/2010
compatible with a dermoid. No ectopic gestational sac, tubal ring,
or embryo can be identified
IMPRESSION: Abnormal findings are again noted. There is no gestational sac. Only
an irregular fluid collection with decidual cast is noted.
Differential diagnosis includes ectopic pregnancy and spontaneous
abortion.

## 2015-04-07 ENCOUNTER — Emergency Department (HOSPITAL_COMMUNITY)
Admission: EM | Admit: 2015-04-07 | Discharge: 2015-04-07 | Disposition: A | Discharge status: Home / Self Care | Payer: Self-pay | Attending: Emergency Medicine | Admitting: Emergency Medicine

## 2015-04-07 ENCOUNTER — Encounter (HOSPITAL_COMMUNITY): Payer: Self-pay

## 2015-04-07 DIAGNOSIS — Z3202 Encounter for pregnancy test, result negative: Secondary | ICD-10-CM | POA: Insufficient documentation

## 2015-04-07 DIAGNOSIS — N390 Urinary tract infection, site not specified: Secondary | ICD-10-CM | POA: Insufficient documentation

## 2015-04-07 LAB — URINALYSIS, ROUTINE W REFLEX MICROSCOPIC
BILIRUBIN URINE: NEGATIVE
Glucose, UA: NEGATIVE mg/dL
Hgb urine dipstick: NEGATIVE
Ketones, ur: NEGATIVE mg/dL
NITRITE: NEGATIVE
PH: 6 (ref 5.0–8.0)
Protein, ur: NEGATIVE mg/dL
SPECIFIC GRAVITY, URINE: 1.02 (ref 1.005–1.030)

## 2015-04-07 LAB — COMPREHENSIVE METABOLIC PANEL
ALBUMIN: 4 g/dL (ref 3.5–5.0)
ALT: 60 U/L — ABNORMAL HIGH (ref 14–54)
ANION GAP: 11 (ref 5–15)
AST: 26 U/L (ref 15–41)
Alkaline Phosphatase: 122 U/L (ref 38–126)
BUN: 11 mg/dL (ref 6–20)
CHLORIDE: 110 mmol/L (ref 101–111)
CO2: 23 mmol/L (ref 22–32)
Calcium: 9.1 mg/dL (ref 8.9–10.3)
Creatinine, Ser: 0.57 mg/dL (ref 0.44–1.00)
GFR calc Af Amer: >60 mL/min (ref >60)
GFR calc non Af Amer: >60 mL/min (ref >60)
GLUCOSE: 94 mg/dL (ref 65–99)
POTASSIUM: 3.7 mmol/L (ref 3.5–5.1)
SODIUM: 144 mmol/L (ref 135–145)
Total Bilirubin: 0.3 mg/dL (ref 0.3–1.2)
Total Protein: 7.2 g/dL (ref 6.5–8.1)

## 2015-04-07 LAB — CBC
HEMATOCRIT: 40.2 % (ref 36.0–46.0)
HEMOGLOBIN: 13.5 g/dL (ref 12.0–15.0)
MCH: 26.6 pg (ref 26.0–34.0)
MCHC: 33.6 g/dL (ref 30.0–36.0)
MCV: 79.3 fL (ref 78.0–100.0)
Platelets: 295 10*3/uL (ref 150–400)
RBC: 5.07 MIL/uL (ref 3.87–5.11)
RDW: 14.3 % (ref 11.5–15.5)
WBC: 6.6 10*3/uL (ref 4.0–10.5)

## 2015-04-07 LAB — URINE MICROSCOPIC-ADD ON

## 2015-04-07 LAB — LIPASE, BLOOD: Lipase: 30 U/L (ref 11–51)

## 2015-04-07 LAB — POC URINE PREG, ED: Preg Test, Ur: NEGATIVE

## 2015-04-07 MED ORDER — CEPHALEXIN 500 MG PO CAPS: 500.0000 mg | ORAL_CAPSULE | Freq: Three times a day (TID) | ORAL | Status: DC

## 2015-04-07 NOTE — ED Notes (Signed)
Patient here with 2 days LLQ pain with dysuria. Denies any other associated symptoms

## 2015-04-07 NOTE — ED Provider Notes (Signed)
CSN: [id number]     Arrival date & time 04/07/15  1242 History   First MD Initiated Contact with Patient 04/07/15 1558     Chief Complaint  Patient presents with  . Abdominal Pain     (Consider location/radiation/quality/duration/timing/severity/associated sxs/prior Treatment) Patient is a 35 y.o. female presenting with dysuria. The history is provided by the patient.  Dysuria Pain quality:  Aching Pain severity:  Mild Onset quality:  Gradual Duration:  3 days Timing:  Constant Progression:  Unchanged Chronicity:  New Recent urinary tract infections: no   Relieved by:  Nothing Worsened by:  Nothing tried Ineffective treatments:  None tried Urinary symptoms: frequent urination   Urinary symptoms: no foul-smelling urine   Associated symptoms: abdominal pain (LLQ)   Associated symptoms: no flank pain   Associated symptoms comment:  Back pain over loler back Risk factors: no hx of pyelonephritis, not pregnant and no recurrent urinary tract infections     Past Medical History  Diagnosis Date  . No pertinent past medical history    Past Surgical History  Procedure Laterality Date  . No past surgeries     Family History  Problem Relation Age of Onset  . Anesthesia problems Neg Hx   . Hearing loss Neg Hx    Social History  Substance Use Topics  . Smoking status: Never Smoker   . Smokeless tobacco: Never Used  . Alcohol Use: No   OB History    Gravida Para Term Preterm AB TAB SAB Ectopic Multiple Living   0 1 0 1 0 0 6     Review of Systems  Gastrointestinal: Positive for abdominal pain (LLQ).  Genitourinary: Positive for dysuria. Negative for flank pain.  All other systems reviewed and are negative.     Allergies  Review of patient's allergies indicates no known allergies.  Home Medications   Prior to Admission medications   Medication Sig Start Date End Date Taking? Authorizing Provider  Prenatal Vit-Fe Fumarate-FA (PRENATAL MULTIVITAMIN) TABS Take  1 tablet by mouth daily.    Historical Provider, MD   BP 121/68 mmHg  Pulse 78  Temp(Src) 98.1 F (36.7 C) (Oral)  Resp 16  Ht  (1.626 m)  Wt 168 lb (76.204 kg)  BMI 28.82 kg/m2  SpO2 100% Physical Exam  Constitutional: She is oriented to person, place, and time. She appears well-developed and well-nourished. No distress.  HENT:  Head: Normocephalic.  Eyes: Conjunctivae are normal.  Neck: Neck supple. No tracheal deviation present.  Cardiovascular: Normal rate and regular rhythm.   Pulmonary/Chest: Effort normal. No respiratory distress.  Abdominal: Soft. She exhibits no distension. There is no tenderness. There is no rebound and no guarding.  Neurological: She is alert and oriented to person, place, and time.  Skin: Skin is warm and dry.  Psychiatric: She has a normal mood and affect.  Vitals reviewed.   ED Course  Procedures (including critical care time) Labs Review Labs Reviewed  COMPREHENSIVE METABOLIC PANEL - Abnormal; Notable for the following:    ALT 60 (*)    All other components within normal limits  URINALYSIS, ROUTINE W REFLEX MICROSCOPIC (NOT AT [facility]) - Abnormal; Notable for the following:    Leukocytes, UA SMALL (*)    All other components within normal limits  URINE MICROSCOPIC-ADD ON - Abnormal; Notable for the following:    Squamous Epithelial / LPF 0-5 (*)    Bacteria, UA FEW (*)    All other components within normal  limits  URINE CULTURE  LIPASE, BLOOD  CBC  POC URINE PREG, ED    Imaging Review No results found. I have personally reviewed and evaluated these images and lab results as part of my medical decision-making.   EKG Interpretation None      MDM   Final diagnoses:  Urinary tract infection without hematuria, site unspecified    35 y.o. female presents with typical urinary tract infections symptoms of dysuria with pain migrating into the back and left lower quadrant of the abdomen. She has no vaginal bleeding or discharge, is  not pregnant currently, has some mild pyuria and bacteriuria on UA. Will treat empirically with Keflex pending culture. Return precautions discussed. Patient needs to establish primary care in the area and was provided contact information to do so.     Lyndal Pulley, MD 04/07/15 [phone]

## 2015-04-07 NOTE — Discharge Instructions (Signed)
Antibiticos (Antibiotic Medicine) Los antibiticos se utilizan para tratar las infecciones causadas por bacterias. Estos medicamentos daan o matan las bacterias que producen enfermedades. CMO SE ELIGE UN ANTIBITICO? Un antibitico se elige sobre la base de muchos factores. Para ayudar al mdico a escoger uno para usted, infrmele lo siguiente:  Si tiene Nigeralguna alergia.  Si est embarazada o planea estarlo.  Si est amamantando.  Si est tomando algn medicamento. Por ejemplo, medicamentos recetados y de [address], as como remedios a base de hierbas.  Si tiene una enfermedad o un problema que an no haya mencionado. El mdico tambin tendr en cuenta lo siguiente:  La frecuencia con la que se debe tomar el medicamento.  Los efectos secundarios frecuentes del medicamento.  El costo del [place]medicamento.  El sabor del [place]medicamento. Si tiene dudas sobre el motivo de la eleccin de un antibitico, pregunte. CUNTO TIEMPO DEBO TOMAR EL ANTIBITICO? Contine tomando el antibitico durante el tiempo que le haya indicado el mdico. Aunque se sienta mejor, no deje de tomarlo. Si deja de tomarlo demasiado pronto:  Puede empezar a sentirse mal nuevamente.  Puede ser ms difcil tratar la infeccin.  Pueden presentarse complicaciones. QU SUCEDE SI OMITO UNA DOSIS? Intente no omitir ninguna dosis del medicamento. Si omite una dosis, tmela tan pronto como sea posible. Sin embargo, si ya es casi la hora de la siguiente dosis:  Si est tomando 2dosis diarias, tome la dosis que omiti y la siguiente dosis con una diferencia de 5 o 6horas.  Si est tomando 3o ms dosis diarias, tome la dosis que omiti y la siguiente dosis con una diferencia de 2 o 4horas; luego retome los horarios normales. Si no puede compensar una dosis que omiti, tome la siguiente dosis prevista puntualmente. A continuacin, tome la dosis que omiti despus de haber tomado todas las dosis como se lo haya  recomendado el mdico, como si le faltara tomar una dosis ms. LOS ANTIBITICOS AFECTAN LA ANTICONCEPCIN? Es posible que los anticonceptivos no resulten eficaces mientras se toman antibiticos. Si est tomando pldoras anticonceptivas, siga tomndolas como lo hace habitualmente y [place]utilice un segundo mtodo anticonceptivo, como un preservativo, para Location managerevitar el embarazo no deseado. Siga utilizando el segundo mtodo anticonceptivo hasta terminar el ciclo actual de 1mes de pldoras anticonceptivas. OTRA INFORMACIN:  Si le sobra medicamento, deseche el remanente.  No tome nunca los antibiticos de Engineer, maintenance (IT)otra persona.  No tome nunca los antibiticos sobrantes. SOLICITE ATENCIN MDICA SI:  Empeora.  No se siente mejor despus de [address]unos das de haber empezado a tomar el antibitico.  Vomita.  Le aparecen placas blancas en la boca.  Tiene un dolor articular nuevo que aparece despus de Corporate investment bankerempezar a tomar el antibitico.  Tiene dolores musculares nuevos que aparecen despus de Corporate investment bankerempezar a tomar el antibitico.  Tuvo fiebre antes de Corporate investment bankerempezar a Designer, industrial/producttomar el medicamento, y este sntoma regresa.  Tiene sntomas de Runner, broadcasting/film/videouna reaccin alrgica, como una erupcin cutnea que le produce picazn. Si esto ocurre, deje de tomar el antibitico. SOLICITE ATENCIN MDICA DE INMEDIATO SI:  La orina se vuelve oscura o de color sangre.  La piel se vuelve amarillenta.  Le salen hematomas o sangra fcilmente.  Tiene diarrea y clicos abdominales intensos.  Tiene un dolor de cabeza intenso.  Tiene signos de Runner, broadcasting/film/videouna reaccin alrgica grave, por ejemplo:  Problemas respiratorios.  Sibilancias.  Hinchazn de los labios, la lengua o el rostro.  Desmayos.  Ampollas en la piel o en la boca. Si tiene signos de [place] reaccin  alrgica grave, deje de tomar el antibitico de inmediato.   Esta informacin no tiene Theme park manager el consejo del mdico. Asegrese de hacerle al mdico cualquier pregunta que tenga.   Document  Released: 05/22/2007 Document Revised: 11/03/2014 Elsevier Interactive Patient Education [employer]

## 2015-04-08 LAB — URINE CULTURE

## 2021-02-08 ENCOUNTER — Other Ambulatory Visit: Payer: Self-pay

## 2021-02-08 ENCOUNTER — Emergency Department (HOSPITAL_BASED_OUTPATIENT_CLINIC_OR_DEPARTMENT_OTHER): Payer: Self-pay | Admitting: Radiology

## 2021-02-08 ENCOUNTER — Emergency Department (HOSPITAL_BASED_OUTPATIENT_CLINIC_OR_DEPARTMENT_OTHER)
Admission: EM | Admit: 2021-02-08 | Discharge: 2021-02-08 | Disposition: A | Discharge status: Home / Self Care | Payer: Self-pay | Attending: Emergency Medicine | Admitting: Emergency Medicine

## 2021-02-08 ENCOUNTER — Encounter (HOSPITAL_BASED_OUTPATIENT_CLINIC_OR_DEPARTMENT_OTHER): Payer: Self-pay | Admitting: Emergency Medicine

## 2021-02-08 DIAGNOSIS — Z23 Encounter for immunization: Secondary | ICD-10-CM | POA: Insufficient documentation

## 2021-02-08 DIAGNOSIS — W540XXA Bitten by dog, initial encounter: Secondary | ICD-10-CM

## 2021-02-08 DIAGNOSIS — S71151A Open bite, right thigh, initial encounter: Secondary | ICD-10-CM | POA: Insufficient documentation

## 2021-02-08 MED ORDER — TETANUS-DIPHTH-ACELL PERTUSSIS 5-2.5-18.5 LF-MCG/0.5 IM SUSY
0.5000 mL | PREFILLED_SYRINGE | Freq: Once | INTRAMUSCULAR | Status: AC
  Administered 2021-02-08: 12:00:00 0.5 mL via INTRAMUSCULAR
  Filled 2021-02-08: qty 0.5

## 2021-02-08 MED ORDER — AMOXICILLIN-POT CLAVULANATE 875-125 MG PO TABS: 1.0000 | ORAL_TABLET | Freq: Two times a day (BID) | ORAL | 0 refills | Status: AC

## 2021-02-08 NOTE — Discharge Instructions (Addendum)
Follow up with animal control to verify dog's rabies vaccine status. If animal control is not able to locate the dog, you will need to start your rabies vaccines within 10 days of the bite.  Take Augmentin as prescribed and complete the full course to prevent infection. Keep wound clean and dry. Recheck with your doctor in 2 days.    Haga un seguimiento con el control de animales para verificar el estado de la vacuna contra la rabia del perro. Si el control de animales no puede localizar al perro, Medical illustrator con las vacunas contra la rabia dentro de los [address] posteriores a la mordedura.  Tome Augmentin segn lo recetado y complete el curso completo para prevenir infecciones. Mantenga la herida limpia y seca. Vuelva a consultar con su mdico en 2 das.

## 2021-02-08 NOTE — ED Provider Notes (Signed)
[facility] EMERGENCY DEPT Provider Note   CSN: [id number] Arrival date & time: 02/08/21  1102     History Chief Complaint  Patient presents with   Animal Bite    [name] is a 40 y.o. female.  40 year old female presents with dog bite to posterior right thigh, bitten by neighbors dog today. No other injuries. Last td unknown, denies possibility of pregnancy.       Past Medical History:  Diagnosis Date   No pertinent past medical history     There are no problems to display for this patient.   Past Surgical History:  Procedure Laterality Date   NO PAST SURGERIES       OB History     Gravida  8   Para  6   Term  6   Preterm  0   AB  1   Living  6      SAB  1   IAB  0   Ectopic  0   Multiple  0   Live Births  1           Family History  Problem Relation Age of Onset   Anesthesia problems Neg Hx    Hearing loss Neg Hx     Social History   Tobacco Use   Smoking status: Never   Smokeless tobacco: Never  Substance Use Topics   Alcohol use: No   Drug use: No    Home Medications Prior to Admission medications   Medication Sig Start Date End Date Taking? Authorizing Provider  amoxicillin-clavulanate (AUGMENTIN) 875-125 MG tablet Take 1 tablet by mouth every 12 (twelve) hours for 5 days. 02/08/21 02/13/21 Yes Jeannie Fend, PA-C    Allergies    Patient has no known allergies.  Review of Systems   Review of Systems  Constitutional:  Negative for fever.  Musculoskeletal:  Positive for myalgias. Negative for arthralgias.  Skin:  Positive for wound.  Allergic/Immunologic: Negative for immunocompromised state.  Neurological:  Negative for weakness and numbness.  Hematological:  Does not bruise/bleed easily.   Physical Exam Updated Vital Signs BP 135/75 (BP Location: Right Arm)    Pulse 95    Temp 97.9 F (36.6 C) (Temporal)    Resp 18    Ht 5\' 1"  (1.549 m)    Wt 97.5 kg    SpO2 100%    BMI 40.61  kg/m   Physical Exam Vitals and nursing note reviewed.  Constitutional:      General: She is not in acute distress.    Appearance: She is well-developed. She is not diaphoretic.  HENT:     Head: Normocephalic and atraumatic.  Cardiovascular:     Pulses: Normal pulses.  Pulmonary:     Effort: Pulmonary effort is normal.  Musculoskeletal:        General: Swelling, tenderness and signs of injury present.     Comments: Single puncture wound to posterior right thigh with surrounding abrasion/contusion consistent with bite.   Skin:    General: Skin is warm and dry.     Findings: No erythema or rash.  Neurological:     Mental Status: She is alert and oriented to person, place, and time.     Sensory: No sensory deficit.     Motor: No weakness.     Gait: Gait normal.  Psychiatric:        Behavior: Behavior normal.    ED Results / Procedures / Treatments   Labs (  all labs ordered are listed, but only abnormal results are displayed) Labs Reviewed - No data to display  EKG None  Radiology DG Femur Min 2 Views Right  Result Date: 02/08/2021 CLINICAL DATA:  Dog bite to the lateral and posterior surface of the right femur. Question foreign body. EXAM: RIGHT FEMUR 2 VIEWS COMPARISON:  None. FINDINGS: The mineralization and alignment are normal. There is no evidence of acute fracture or dislocation. No foreign body or soft tissue emphysema identified. The joint spaces appear preserved at the hip and knee. IMPRESSION: No evidence of foreign body, soft tissue emphysema or acute osseous injury in the right thigh. Electronically Signed   By: Carey Bullocks M.D.   On: 02/08/2021 12:42    Procedures Procedures   Medications Ordered in ED Medications  Tdap (BOOSTRIX) injection 0.5 mL (0.5 mLs Intramuscular Given 02/08/21 1216)    ED Course  I have reviewed the triage vital signs and the nursing notes.  Pertinent labs & imaging results that were available during my care of the patient  were reviewed by me and considered in my medical decision making (see chart for details).  Clinical Course as of 02/08/21 1247  Wed Feb 08, 2021  4561 40 year old female with dog bite to posterior right thigh. Found to have puncture wound. Wound was cleaned. Will obtain XR to r/o FB. Dc with Augmentin and advised to follow up with animal control to verify dog's vaccine status/quarantine if appropriate. Recommend wound check with PCP in 2 days.  [LM]    Clinical Course User Index [LM] Alden Hipp   MDM Rules/Calculators/A&P                           Final Clinical Impression(s) / ED Diagnoses Final diagnoses:  Dog bite of right thigh, initial encounter    Rx / DC Orders ED Discharge Orders          Ordered    amoxicillin-clavulanate (AUGMENTIN) 875-125 MG tablet  Every 12 hours        02/08/21 1211             Jeannie Fend, PA-C 02/08/21 1247    Sloan Leiter, DO 02/08/21 1606

## 2021-02-08 NOTE — ED Notes (Signed)
ED Provider at bedside. 

## 2021-02-08 NOTE — ED Notes (Signed)
Animal Control contacted regarding Vaccination Status of Dog.

## 2021-02-08 NOTE — ED Notes (Signed)
RN provided AVS using Teachback Method. Patient verbalizes understanding of Discharge Instructions. Opportunity for Questioning and Answers were provided by RN. Patient Discharged from ED ambulatory to Home with Family. ? ?

## 2021-02-08 NOTE — ED Triage Notes (Signed)
Pt arrives to ED with c/o dog bite. Pt reports that she was bitten in the right leg. Pt reports that she is unsure if dog is vaccinated as it was a neighbors dog.

## 2021-02-08 NOTE — ED Notes (Signed)
Wound Irrigated with Wound Cleanser by PA.

## 2021-02-08 NOTE — ED Notes (Signed)
Patient returned from XRAY
